# Patient Record
Sex: Female | Born: 1980 | Race: White | Hispanic: No | Marital: Married | Smoking: Former smoker
Health system: Southern US, Community
[De-identification: ages and names within clinical notes are randomized; demographics above are authoritative.]

## PROBLEM LIST (undated history)

## (undated) DIAGNOSIS — R87629 Unspecified abnormal cytological findings in specimens from vagina: Secondary | ICD-10-CM

## (undated) DIAGNOSIS — IMO0002 Reserved for concepts with insufficient information to code with codable children: Secondary | ICD-10-CM

## (undated) DIAGNOSIS — R87619 Unspecified abnormal cytological findings in specimens from cervix uteri: Secondary | ICD-10-CM

## (undated) DIAGNOSIS — Z8619 Personal history of other infectious and parasitic diseases: Secondary | ICD-10-CM

## (undated) HISTORY — DX: Unspecified abnormal cytological findings in specimens from cervix uteri: R87.619

## (undated) HISTORY — DX: Unspecified abnormal cytological findings in specimens from vagina: R87.629

## (undated) HISTORY — DX: Personal history of other infectious and parasitic diseases: Z86.19

## (undated) HISTORY — DX: Reserved for concepts with insufficient information to code with codable children: IMO0002

## (undated) HISTORY — PX: CRYOTHERAPY: SHX1416

---

## 1999-08-15 ENCOUNTER — Emergency Department (HOSPITAL_COMMUNITY): Admission: EM | Admit: 1999-08-15 | Discharge: 1999-08-15 | Payer: Self-pay | Admitting: Emergency Medicine

## 2000-10-19 ENCOUNTER — Emergency Department (HOSPITAL_COMMUNITY): Admission: EM | Admit: 2000-10-19 | Discharge: 2000-10-19 | Payer: Self-pay

## 2000-10-19 ENCOUNTER — Encounter: Payer: Self-pay | Admitting: Emergency Medicine

## 2004-02-12 ENCOUNTER — Emergency Department (HOSPITAL_COMMUNITY): Admission: EM | Admit: 2004-02-12 | Discharge: 2004-02-12 | Payer: Self-pay | Admitting: Emergency Medicine

## 2004-07-03 ENCOUNTER — Emergency Department (HOSPITAL_COMMUNITY): Admission: EM | Admit: 2004-07-03 | Discharge: 2004-07-03 | Payer: Self-pay | Admitting: Emergency Medicine

## 2004-07-17 ENCOUNTER — Ambulatory Visit (HOSPITAL_COMMUNITY): Admission: RE | Admit: 2004-07-17 | Discharge: 2004-07-17 | Payer: Self-pay | Admitting: Obstetrics and Gynecology

## 2004-10-02 ENCOUNTER — Ambulatory Visit (HOSPITAL_COMMUNITY): Admission: RE | Admit: 2004-10-02 | Discharge: 2004-10-02 | Payer: Self-pay | Admitting: Obstetrics and Gynecology

## 2005-02-09 ENCOUNTER — Ambulatory Visit (HOSPITAL_COMMUNITY): Admission: RE | Admit: 2005-02-09 | Discharge: 2005-02-09 | Payer: Self-pay | Admitting: Obstetrics and Gynecology

## 2005-03-04 ENCOUNTER — Inpatient Hospital Stay (HOSPITAL_COMMUNITY): Admission: AD | Admit: 2005-03-04 | Discharge: 2005-03-06 | Payer: Self-pay | Admitting: Obstetrics and Gynecology

## 2005-04-21 ENCOUNTER — Other Ambulatory Visit: Admission: RE | Admit: 2005-04-21 | Discharge: 2005-04-21 | Payer: Self-pay | Admitting: Obstetrics and Gynecology

## 2005-07-19 HISTORY — PX: HAND SURGERY: SHX662

## 2005-11-17 IMAGING — US US OB TRANSVAGINAL
1 series · 14 of 27 positions shown · non-contrast
Comparison: none

CLINICAL DATA: Threatened abortion.  7 weeks.  Follow-up.  G3 P0 AB2.  LMP 05/19/04.
 TRANSVAGINAL OBSTETRICAL ULTRASOUND:  
 Transvaginal scanning of the pelvis was performed.
 There is an intrauterine gestational sac containing a single living embryo with a regular heart rate of 168 bpm.  By crown rump length, the gestation is estimated at 7 weeks 3 days.  This is within two days of first ultrasound of 07/03/04 and 1 week behind LMP dating.  Growth has been appropriate as compared to prior ultrasound.  Yolk sac and amnion are present.  There may be a very small subchorionic hemorrhage present. 
 Right ovary is normal measuring 2.5 x 1.1 x 1.5 cm.  The left ovary is normal measuring 2.4 x 1.4 x 1.8 cm.  It contains a resolving corpus luteum.  No free pelvic fluid is noted.

[Series 1: us ob transvaginal · 14 of 27 slices shown]
[im 1/27]
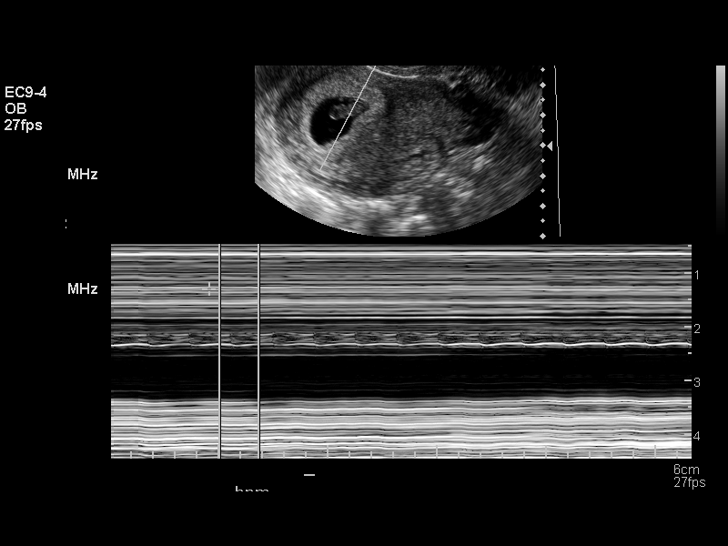
[im 3/27]
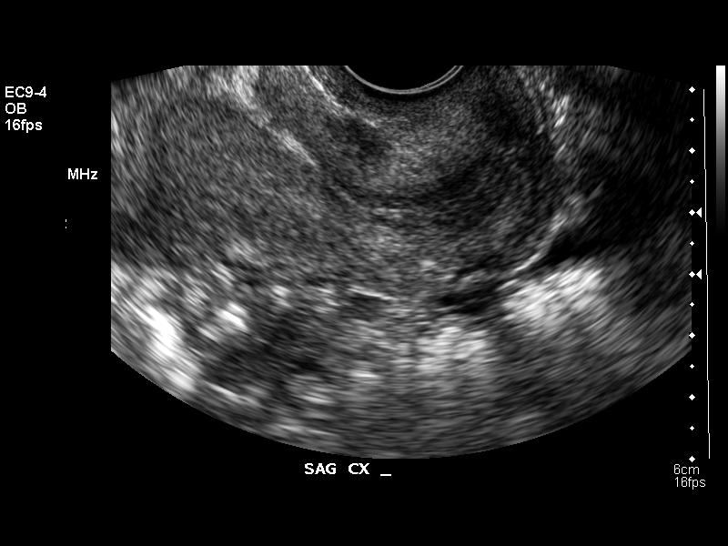
[im 5/27]
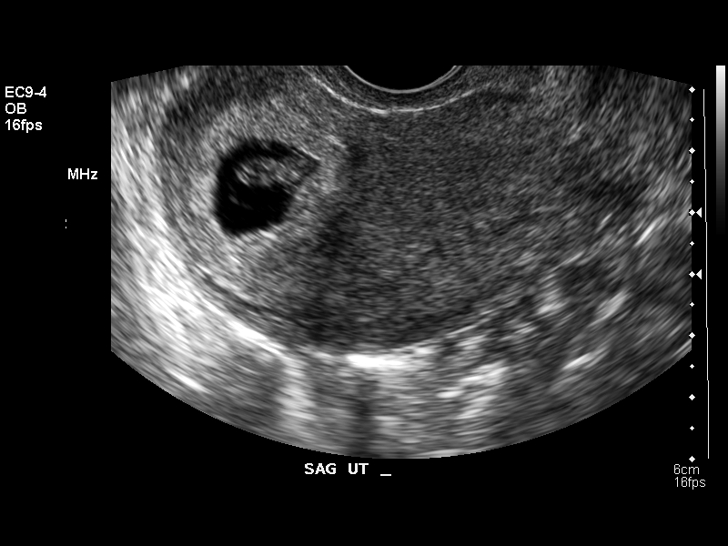
[im 7/27]
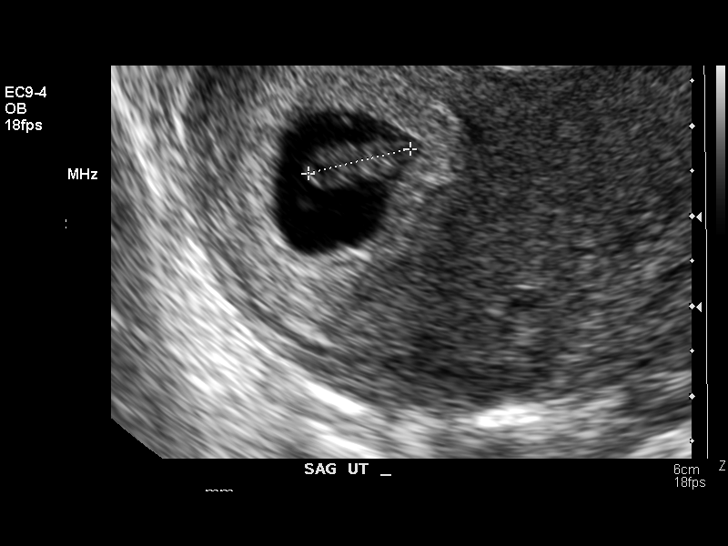
[im 9/27]
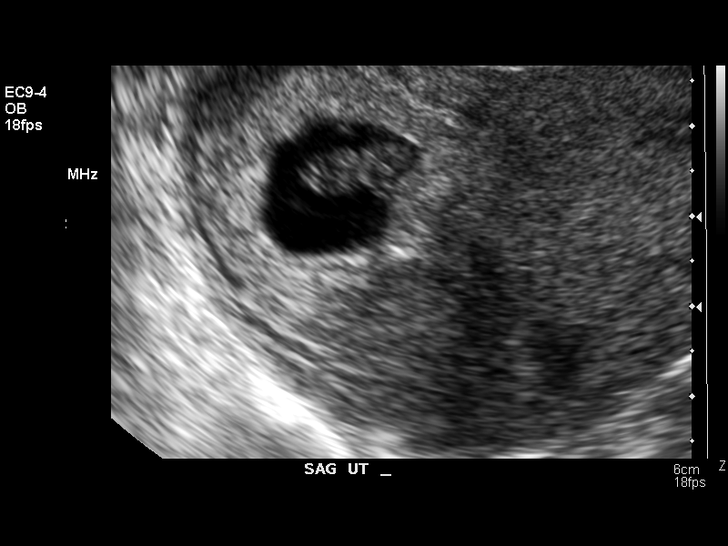
[im 11/27]
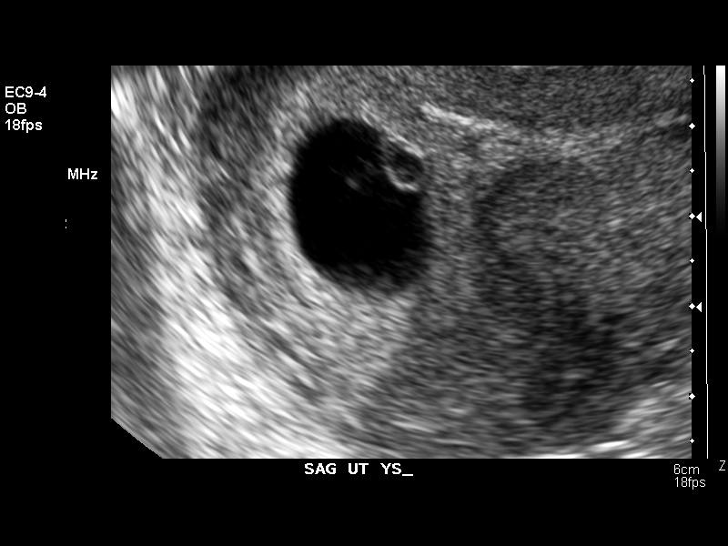
[im 13/27]
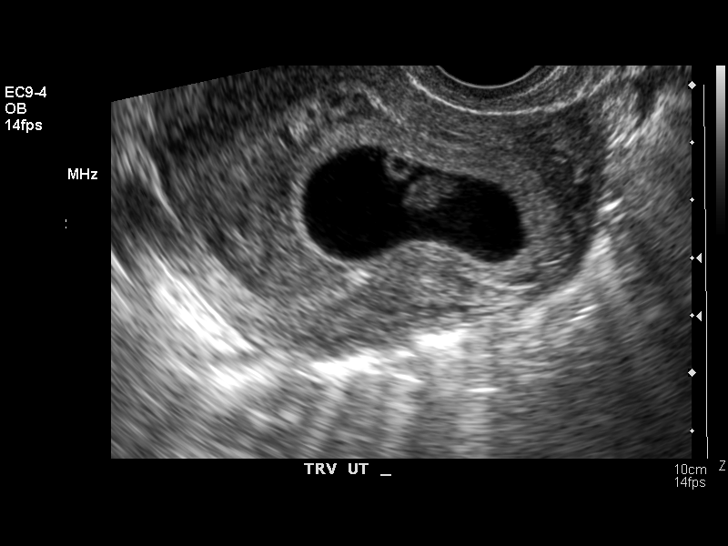
[im 15/27]
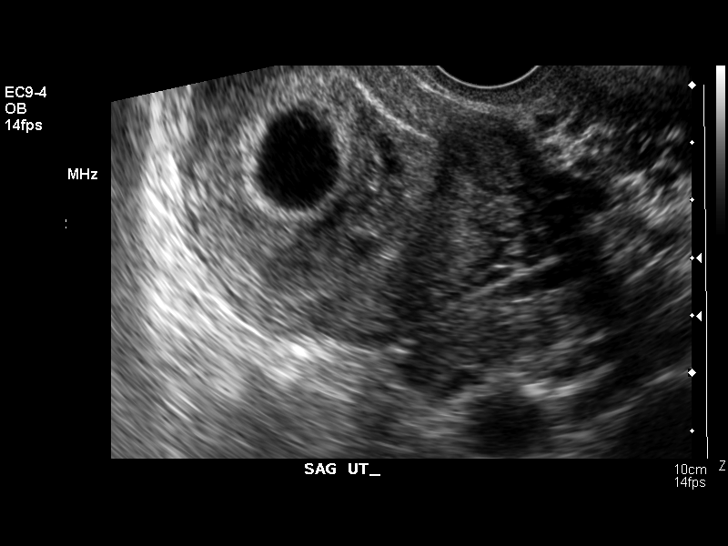
[im 17/27]
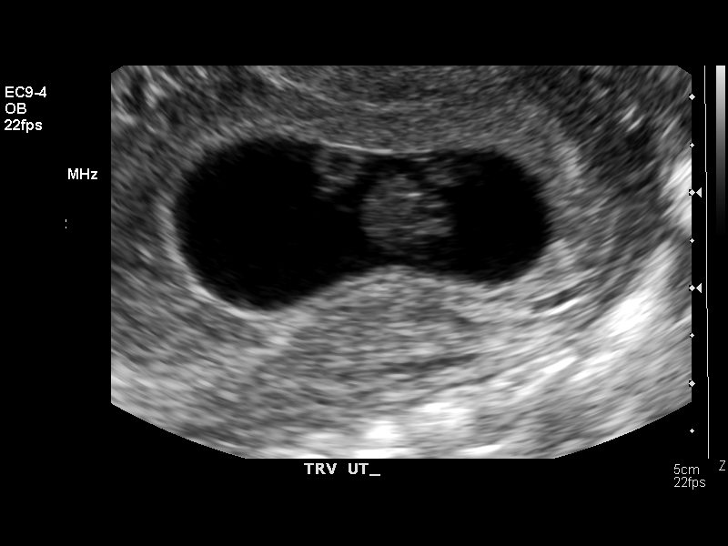
[im 19/27]
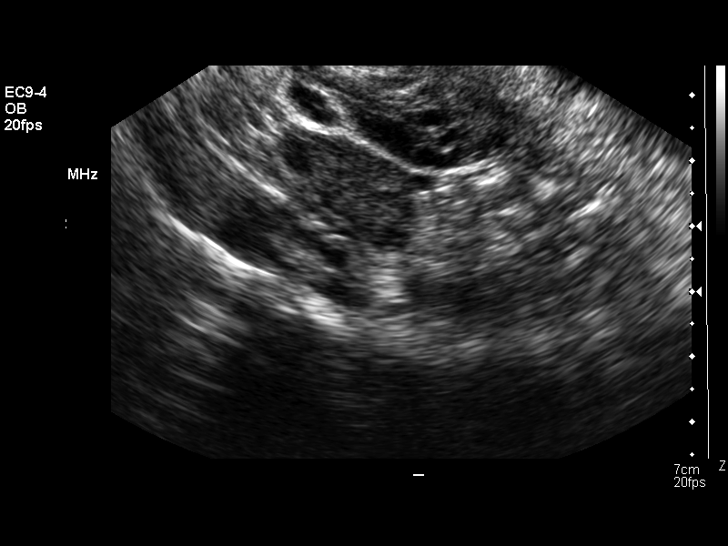
[im 21/27]
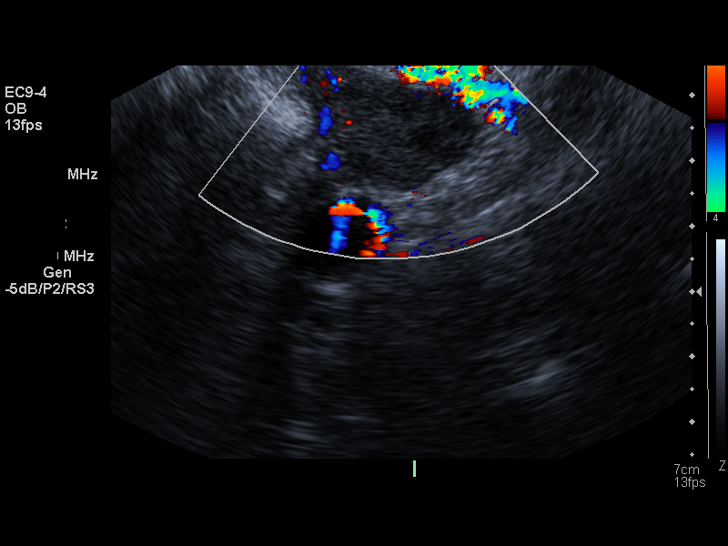
[im 23/27]
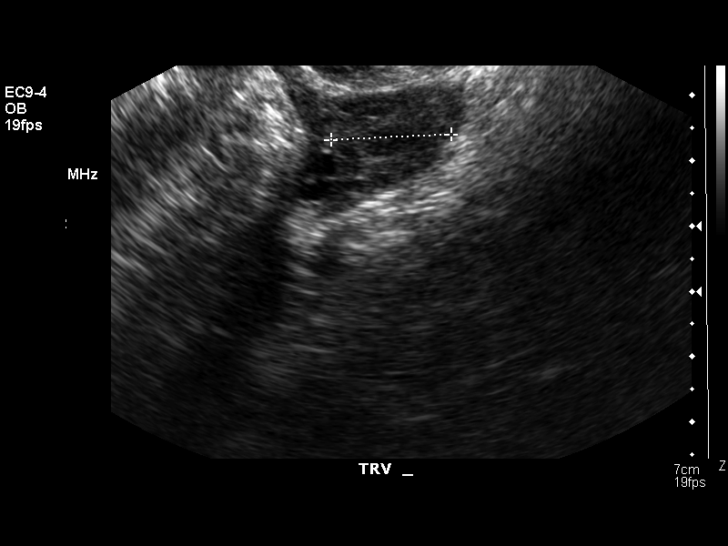
[im 25/27]
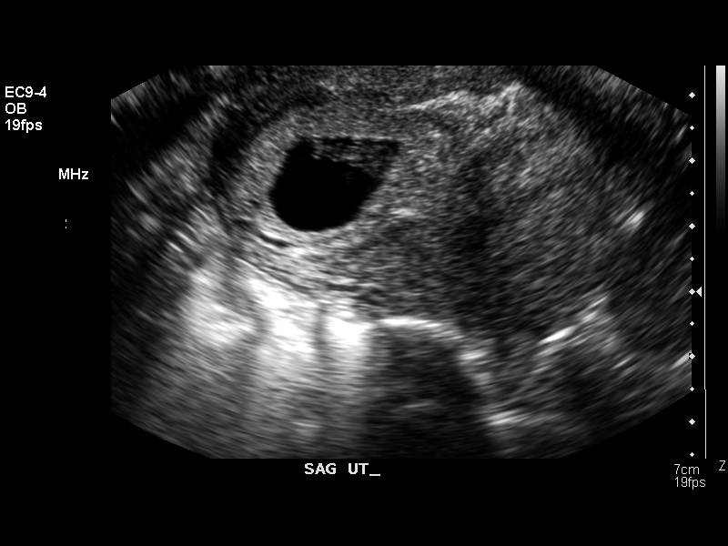
[im 27/27]
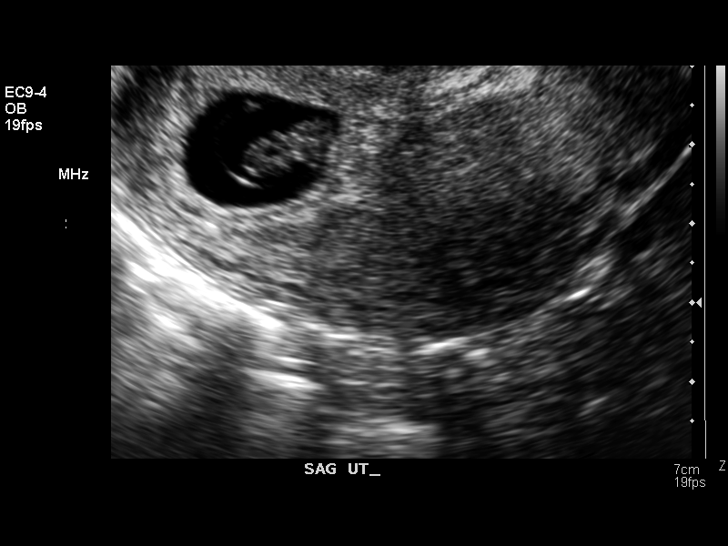

[14 of 27 positions shown; findings below may reference images not displayed]

IMPRESSION: 1.  Single living intrauterine embryo.  Patient is 7 weeks 5 days by first ultrasound and measures 7 weeks 3 days today indicating appropriate growth as compared to prior ultrasound.  
 2.  Corpus luteum in the left ovary.  Normal right ovary.  
 3.  Very small subchorionic hemorrhage.

## 2006-01-18 ENCOUNTER — Emergency Department (HOSPITAL_COMMUNITY): Admission: EM | Admit: 2006-01-18 | Discharge: 2006-01-18 | Payer: Self-pay | Admitting: Emergency Medicine

## 2006-01-25 ENCOUNTER — Ambulatory Visit (HOSPITAL_COMMUNITY): Admission: RE | Admit: 2006-01-25 | Discharge: 2006-01-25 | Payer: Self-pay | Admitting: Orthopedic Surgery

## 2006-05-11 ENCOUNTER — Other Ambulatory Visit: Admission: RE | Admit: 2006-05-11 | Discharge: 2006-05-11 | Payer: Self-pay | Admitting: Obstetrics and Gynecology

## 2010-08-28 ENCOUNTER — Inpatient Hospital Stay (HOSPITAL_COMMUNITY)
Admission: AD | Admit: 2010-08-28 | Discharge: 2010-08-30 | DRG: 775 | Disposition: A | Discharge status: Home / Self Care | Payer: Commercial Managed Care - PPO | Source: Ambulatory Visit | Attending: Obstetrics and Gynecology | Admitting: Obstetrics and Gynecology

## 2010-08-28 ENCOUNTER — Other Ambulatory Visit: Payer: Self-pay | Admitting: Obstetrics and Gynecology

## 2010-08-29 LAB — CBC
HCT: 34.8 % — ABNORMAL LOW (ref 36.0–46.0)
Hemoglobin: 12 g/dL (ref 12.0–15.0)
MCH: 32.7 pg (ref 26.0–34.0)
MCHC: 34.5 g/dL (ref 30.0–36.0)
Platelets: 178 10*3/uL (ref 150–400)
RBC: 3.67 MIL/uL — ABNORMAL LOW (ref 3.87–5.11)
RDW: 12.1 % (ref 11.5–15.5)
WBC: 14.8 10*3/uL — ABNORMAL HIGH (ref 4.0–10.5)

## 2010-09-01 ENCOUNTER — Inpatient Hospital Stay (HOSPITAL_COMMUNITY): Admission: AD | Admit: 2010-09-01 | Payer: Self-pay | Source: Home / Self Care | Admitting: Obstetrics and Gynecology

## 2010-09-06 ENCOUNTER — Inpatient Hospital Stay (HOSPITAL_COMMUNITY)
Admission: AD | Admit: 2010-09-06 | Discharge: 2010-09-06 | Disposition: A | Discharge status: Home / Self Care | Payer: Commercial Managed Care - PPO | Source: Ambulatory Visit | Attending: Obstetrics and Gynecology | Admitting: Obstetrics and Gynecology

## 2010-09-06 DIAGNOSIS — O139 Gestational [pregnancy-induced] hypertension without significant proteinuria, unspecified trimester: Secondary | ICD-10-CM | POA: Insufficient documentation

## 2010-09-06 LAB — CBC
Hemoglobin: 14.9 g/dL (ref 12.0–15.0)
Platelets: 328 10*3/uL (ref 150–400)
RDW: 11.9 % (ref 11.5–15.5)

## 2010-09-06 LAB — URINALYSIS, ROUTINE W REFLEX MICROSCOPIC
Ketones, ur: NEGATIVE mg/dL
Specific Gravity, Urine: 1.01 (ref 1.005–1.030)
Urine Glucose, Fasting: NEGATIVE mg/dL
pH: 5.5 (ref 5.0–8.0)

## 2010-09-06 LAB — COMPREHENSIVE METABOLIC PANEL
Alkaline Phosphatase: 90 U/L (ref 39–117)
BUN: 18 mg/dL (ref 6–23)
Calcium: 9.6 mg/dL (ref 8.4–10.5)
Creatinine, Ser: 1.12 mg/dL (ref 0.4–1.2)
GFR calc Af Amer: >60 mL/min (ref >60)
GFR calc non Af Amer: 57 mL/min — ABNORMAL LOW (ref >60)
Sodium: 136 mEq/L (ref 135–145)
Total Bilirubin: 0.4 mg/dL (ref 0.3–1.2)

## 2010-09-06 LAB — URIC ACID: Uric Acid, Serum: 6.9 mg/dL (ref 2.4–7.0)

## 2010-11-16 ENCOUNTER — Inpatient Hospital Stay (HOSPITAL_COMMUNITY)
Admission: AD | Admit: 2010-11-16 | Discharge: 2010-11-16 | Disposition: A | Discharge status: Home / Self Care | Payer: Commercial Managed Care - PPO | Source: Ambulatory Visit | Attending: Obstetrics and Gynecology | Admitting: Obstetrics and Gynecology

## 2010-11-16 DIAGNOSIS — O9122 Nonpurulent mastitis associated with the puerperium: Secondary | ICD-10-CM | POA: Insufficient documentation

## 2010-11-16 DIAGNOSIS — IMO0002 Reserved for concepts with insufficient information to code with codable children: Secondary | ICD-10-CM | POA: Insufficient documentation

## 2010-12-04 NOTE — H&P (Signed)
NAMEKENIKA, SAHM             ACCOUNT NO.:  [id number]   MEDICAL RECORD NO.:  [id number]          PATIENT TYPE:  INP   LOCATION:  9163                          FACILITY:  [facility]   PHYSICIAN:  Naima A. Dillard, M.D. DATE OF BIRTH:  08-06-1980   DATE OF ADMISSION:  03/04/2005  DATE OF DISCHARGE:                                HISTORY & PHYSICAL   Ms. [name] is a 30 year old single, white female, gravida 3, para 0, 0, 2, 0  at 41-weeks who presents with regular uterine contractions tonight. She  denies leaking and denies signs and symptoms of PIH. She has had some small  spotting since her membranes were stripped in the office today. Her  pregnancy has been followed by [facility] M.D. service and has  been remarkable for:  1.  Cryosurgery.  2.  First trimester spotting.  3.  Previous smoker.  4.  Group B strep negative.  5.  Her cervix in the office today was dilated to 3 cm.   PRENATAL LABS:  Labs collected on August 25, 2004:  hemoglobin 12.8,  hematocrit 36.5, platelets 237,000. Blood type B positive, antibody  negative. RPR nonreactive. Rubella immune. Hepatitis B surface antigen  negative. Pap smear from July, 2005 was within normal limits. Gonorrhea and  Chlamydia from December, 2005 were negative. One hour Glucola from Nov 30, 2004 was 168. Her RPR at that time was nonreactive. Three hour glucose  tolerance test on Dec 07, 2004 was within normal limits. Culture of the  vaginal tract for group B strep, gonorrhea and Chlamydia on January 26, 2005  were all negative.   HISTORY OF PRESENT PREGNANCY:  The patient presented for care at [facility] on August 06, 2004 at [redacted] weeks gestation. She declined a quadruple  screen. The patient had ultrasonography for anatomy at [facility]  [facility] gestation and then had a follow up ultrasound at [facility]  the following day; there are no records here currently. The patient had an  elevated 1 hour Glucola  requiring a 3-hour GTT which was normal. The rest of  her prenatal care was unremarkable.   OBSTETRIC HISTORY:  She is a gravida 3, para 0, 0, 0, 2, 0. In 2000 and in  2002 she had EABs.   PAST MEDICAL HISTORY:  She has NO MEDICATION ALLERGIES. She experienced  menarche at the age of 7 with 8 to 30 day cycles lasting 5 days. She had  cryosurgery in 2000. She has an occasional yeast infection. She reports  having had the usual childhood illnesses. The patient is a previous smoker.   FAMILY HISTORY:  Remarkable for mother with Crohn's disease.   GENETIC HISTORY:  Remarkable for the patient's cousin having a heart murmur.   SOCIAL HISTORY:  Father of the baby is involved and supportive, his name is  Thayer Ohm. The patient is single. She has 4 years of college education and is  employed full time as a bar tender. Father of the baby has 4 years of  college education and is employed full time in [employer]. Prior to the  pregnancy  patient had approximately 10 drinks per week, and 10 cigarettes per day but  none since positive pregnancy test.   PHYSICAL EXAMINATION:  VITAL SIGNS:  Initial blood pressure was 140/100, now  is 146/73, other vital signs are stable, she is afebrile.  HEENT:  Grossly within normal limits.  CHEST:  Clear to auscultation.  HEART:  Regular rate and rhythm.  ABDOMEN:  Gravid in contour with fundal height extending approximately 40 cm  above the pubic symphysis. Fetal heart rate is reactive. Contractions every  2-3 minutes, mild to moderate.  PELVIC:  Cervix is 4 cm, 90%, vertex at -2 with bulging bag of waters.  EXTREMITIES:  Normal.   ASSESSMENT:  1.  Intrauterine pregnancy at term.  2.  Early labor.  3.  Group B strep negative.   PLAN:  1.  Admit to birthing suites, Dr. Normand Sloop has been notified.  2.  Routine M.D. orders.  3.  PIH labs.  4.  Patient plans epidural as labor progresses.      Cam Hai, C.N.M.      Naima A. Normand Sloop, M.D.   Electronically Signed    KS/MEDQ  D:  03/04/2005  T:  03/04/2005  Job:  [id number]

## 2010-12-04 NOTE — Op Note (Signed)
Amber [name], Amber [name]             ACCOUNT NO.:  [id number]   MEDICAL RECORD NO.:  [id number]          PATIENT TYPE:  AMB   LOCATION:  SDS                          FACILITY:  [facility]   PHYSICIAN:  Dionne Ano. Gramig III, M.D.DATE OF BIRTH:  Dec 13, 1980   DATE OF PROCEDURE:  DATE OF DISCHARGE:                                 OPERATIVE REPORT   PREOPERATIVE DIAGNOSIS:  Displaced left comminuted third metacarpal  fracture.   POSTOPERATIVE DIAGNOSIS:  Displaced left comminuted third metacarpal  fracture.   PROCEDURE:  1.  Open reduction/internal fixation of left third metacarpal fracture.  2.  Stress radiography.   SURGEON:  Dominica Severin, M.D.   ASSISTANT:  None.   COMPLICATIONS:  None.   ANESTHESIA:  General.   TOURNIQUET TIME:  Less than an hour.   INDICATIONS FOR PROCEDURE:  Ms. [name] presents with the above-mentioned  diagnosis.  I have counseled her regarding the risks and benefits of  surgery, and she desires to proceed with the above-mentioned operative  intervention, understanding and accepting the risks and benefits of surgery.   OPERATION IN DETAIL:  Patient was given Ancef preoperatively, taken to the  operating suite and underwent a smooth induction of general anesthesia, laid  supine and appropriately padded, prepped and draped in the usual sterile  fashion with Betadine scrub and paint.  Following this, the patient had a  curvilinear incision made under 250 mmHg of tourniquet control.  Dissection  was carried down.  Periosteum was incised, fracture was elevated, and  following this with combination of Freer elevator, curette and suction  device, the fracture sites were cleaned out.  I then performed provisional  fixation with clamps followed by the introduction of 3 interfragmentary  screws, 2.0 in nature.  This was procedure placed by drilling with a 1.5-  drill bit and placing a 2.0 screw after depth gauge measurement.  This  achieved excellent fixation.  She  was taken through a range of motion and  was stable, it looked quite well.  I was happy with the finger splay and  alignment.  I then closed the periosteal tissue with a combination of 2-0  and 4-0 Vicryl.  The patient had excellent range of motion of her extensor  apparatus.  Following this, Marcaine was placed for postop analgesia.  The  skin edges were sutured with Prolene, tourniquet was deflated and  showed excellent hemostasis without complicating features.  She was placed  in a sterile splint and taken to the recovery room.  She will be given an  additional gram of Ancef on return to see [place] in the office in 7-10 days  after discharge tonight.           ______________________________  Dionne Ano. Everlene Other, M.D.     Nash Mantis  D:  01/25/2006  T:  01/26/2006  Job:  [id number]

## 2011-04-15 ENCOUNTER — Ambulatory Visit (INDEPENDENT_AMBULATORY_CARE_PROVIDER_SITE_OTHER): Payer: Self-pay | Admitting: General Surgery

## 2011-07-20 NOTE — L&D Delivery Note (Signed)
Patient was C/C/+2 and pushed for 5 minutes with epidural.   NSVD  female infant, Apgars 8,9, weight P.   The patient had one midline 2nd degree episiotomy repaired with 2-0 vicryl R. Fundus was firm. EBL was expected. Placenta was delivered intact. Vagina was clear.  Baby was vigorous to bedside.  Fuquan Wilson A

## 2011-09-07 ENCOUNTER — Other Ambulatory Visit: Payer: Self-pay | Admitting: Obstetrics and Gynecology

## 2011-09-28 LAB — OB RESULTS CONSOLE GC/CHLAMYDIA: Gonorrhea: NEGATIVE

## 2011-09-28 LAB — OB RESULTS CONSOLE ABO/RH: RH Type: POSITIVE

## 2011-09-28 LAB — OB RESULTS CONSOLE HEPATITIS B SURFACE ANTIGEN: Hepatitis B Surface Ag: NEGATIVE

## 2012-03-06 LAB — OB RESULTS CONSOLE GBS: GBS: NEGATIVE

## 2012-03-23 ENCOUNTER — Encounter (HOSPITAL_COMMUNITY): Payer: Self-pay | Admitting: *Deleted

## 2012-03-23 ENCOUNTER — Telehealth (HOSPITAL_COMMUNITY): Payer: Self-pay | Admitting: *Deleted

## 2012-03-23 NOTE — Telephone Encounter (Signed)
Preadmission screen  

## 2012-03-30 ENCOUNTER — Inpatient Hospital Stay (HOSPITAL_COMMUNITY)
Admission: AD | Admit: 2012-03-30 | Discharge: 2012-04-01 | DRG: 775 | Disposition: A | Discharge status: Home / Self Care | Payer: Commercial Managed Care - PPO | Source: Ambulatory Visit | Attending: Obstetrics and Gynecology | Admitting: Obstetrics and Gynecology

## 2012-03-30 ENCOUNTER — Inpatient Hospital Stay (HOSPITAL_COMMUNITY): Payer: Commercial Managed Care - PPO | Admitting: Anesthesiology

## 2012-03-30 ENCOUNTER — Encounter (HOSPITAL_COMMUNITY): Payer: Self-pay | Admitting: *Deleted

## 2012-03-30 ENCOUNTER — Encounter (HOSPITAL_COMMUNITY): Payer: Self-pay | Admitting: Anesthesiology

## 2012-03-30 DIAGNOSIS — Z3480 Encounter for supervision of other normal pregnancy, unspecified trimester: Secondary | ICD-10-CM

## 2012-03-30 LAB — CBC
HCT: 38.8 % (ref 36.0–46.0)
MCHC: 34 g/dL (ref 30.0–36.0)
Platelets: 217 10*3/uL (ref 150–400)
RDW: 12.6 % (ref 11.5–15.5)
WBC: 13.3 10*3/uL — ABNORMAL HIGH (ref 4.0–10.5)

## 2012-03-30 MED ORDER — DIPHENHYDRAMINE HCL 50 MG/ML IJ SOLN: 12.5000 mg | INTRAMUSCULAR | Status: DC | PRN

## 2012-03-30 MED ORDER — MAGNESIUM HYDROXIDE 400 MG/5ML PO SUSP: 30.0000 mL | ORAL | Status: DC | PRN

## 2012-03-30 MED ORDER — SODIUM CHLORIDE 0.9 % IJ SOLN: 3.0000 mL | INTRAMUSCULAR | Status: DC | PRN

## 2012-03-30 MED ORDER — FENTANYL 2.5 MCG/ML BUPIVACAINE 1/10 % EPIDURAL INFUSION (WH - ANES)
14.0000 mL/h | INTRAMUSCULAR | Status: DC
  Filled 2012-03-30: qty 60

## 2012-03-30 MED ORDER — PRENATAL MULTIVITAMIN CH
1.0000 | ORAL_TABLET | Freq: Every day | ORAL | Status: DC
  Administered 2012-03-30 – 2012-04-01 (×3): 1 via ORAL
  Filled 2012-03-30 (×3): qty 1

## 2012-03-30 MED ORDER — ACETAMINOPHEN 325 MG PO TABS: 650.0000 mg | ORAL_TABLET | ORAL | Status: DC | PRN

## 2012-03-30 MED ORDER — METHYLERGONOVINE MALEATE 0.2 MG PO TABS: 0.2000 mg | ORAL_TABLET | ORAL | Status: DC | PRN

## 2012-03-30 MED ORDER — SENNOSIDES-DOCUSATE SODIUM 8.6-50 MG PO TABS
2.0000 | ORAL_TABLET | Freq: Every day | ORAL | Status: DC
  Administered 2012-03-30 – 2012-03-31 (×2): 2 via ORAL

## 2012-03-30 MED ORDER — ONDANSETRON HCL 4 MG/2ML IJ SOLN: 4.0000 mg | INTRAMUSCULAR | Status: DC | PRN

## 2012-03-30 MED ORDER — ZOLPIDEM TARTRATE 5 MG PO TABS: 5.0000 mg | ORAL_TABLET | Freq: Every evening | ORAL | Status: DC | PRN

## 2012-03-30 MED ORDER — EPHEDRINE 5 MG/ML INJ: 10.0000 mg | INTRAVENOUS | Status: DC | PRN

## 2012-03-30 MED ORDER — WITCH HAZEL-GLYCERIN EX PADS: 1.0000 "application " | MEDICATED_PAD | CUTANEOUS | Status: DC | PRN

## 2012-03-30 MED ORDER — TETANUS-DIPHTH-ACELL PERTUSSIS 5-2.5-18.5 LF-MCG/0.5 IM SUSP: 0.5000 mL | Freq: Once | INTRAMUSCULAR | Status: DC

## 2012-03-30 MED ORDER — SIMETHICONE 80 MG PO CHEW: 80.0000 mg | CHEWABLE_TABLET | ORAL | Status: DC | PRN

## 2012-03-30 MED ORDER — OXYTOCIN 40 UNITS IN LACTATED RINGERS INFUSION - SIMPLE MED
INTRAVENOUS | Status: AC
  Filled 2012-03-30: qty 1000

## 2012-03-30 MED ORDER — DIBUCAINE 1 % RE OINT: 1.0000 "application " | TOPICAL_OINTMENT | RECTAL | Status: DC | PRN

## 2012-03-30 MED ORDER — CITRIC ACID-SODIUM CITRATE 334-500 MG/5ML PO SOLN: 30.0000 mL | ORAL | Status: DC | PRN

## 2012-03-30 MED ORDER — IBUPROFEN 600 MG PO TABS
600.0000 mg | ORAL_TABLET | Freq: Four times a day (QID) | ORAL | Status: DC | PRN
  Administered 2012-03-30: 600 mg via ORAL
  Filled 2012-03-30: qty 1

## 2012-03-30 MED ORDER — LIDOCAINE HCL (PF) 1 % IJ SOLN
30.0000 mL | INTRAMUSCULAR | Status: DC | PRN
  Filled 2012-03-30: qty 30

## 2012-03-30 MED ORDER — FERROUS SULFATE 325 (65 FE) MG PO TABS
325.0000 mg | ORAL_TABLET | Freq: Two times a day (BID) | ORAL | Status: DC
  Administered 2012-03-31 – 2012-04-01 (×3): 325 mg via ORAL
  Filled 2012-03-30 (×3): qty 1

## 2012-03-30 MED ORDER — LACTATED RINGERS IV SOLN
INTRAVENOUS | Status: DC
  Administered 2012-03-30: 13:00:00 via INTRAVENOUS

## 2012-03-30 MED ORDER — OXYCODONE-ACETAMINOPHEN 5-325 MG PO TABS: 1.0000 | ORAL_TABLET | ORAL | Status: DC | PRN

## 2012-03-30 MED ORDER — LANOLIN HYDROUS EX OINT: TOPICAL_OINTMENT | CUTANEOUS | Status: DC | PRN

## 2012-03-30 MED ORDER — IBUPROFEN 800 MG PO TABS
800.0000 mg | ORAL_TABLET | Freq: Three times a day (TID) | ORAL | Status: DC
  Administered 2012-03-30 – 2012-04-01 (×5): 800 mg via ORAL
  Filled 2012-03-30 (×5): qty 1

## 2012-03-30 MED ORDER — OXYTOCIN BOLUS FROM INFUSION: 500.0000 mL | Freq: Once | INTRAVENOUS | Status: DC

## 2012-03-30 MED ORDER — DIPHENHYDRAMINE HCL 25 MG PO CAPS: 25.0000 mg | ORAL_CAPSULE | Freq: Four times a day (QID) | ORAL | Status: DC | PRN

## 2012-03-30 MED ORDER — FENTANYL 2.5 MCG/ML BUPIVACAINE 1/10 % EPIDURAL INFUSION (WH - ANES)
INTRAMUSCULAR | Status: DC | PRN
  Administered 2012-03-30: 14 mL/h via EPIDURAL

## 2012-03-30 MED ORDER — SODIUM CHLORIDE 0.9 % IV SOLN: 250.0000 mL | INTRAVENOUS | Status: DC | PRN

## 2012-03-30 MED ORDER — BENZOCAINE-MENTHOL 20-0.5 % EX AERO
1.0000 "application " | INHALATION_SPRAY | CUTANEOUS | Status: DC | PRN
  Administered 2012-03-30: 1 via TOPICAL
  Filled 2012-03-30: qty 56

## 2012-03-30 MED ORDER — CYCLOBENZAPRINE HCL 5 MG PO TABS
5.0000 mg | ORAL_TABLET | Freq: Three times a day (TID) | ORAL | Status: DC | PRN
  Filled 2012-03-30: qty 1

## 2012-03-30 MED ORDER — PHENYLEPHRINE 40 MCG/ML (10ML) SYRINGE FOR IV PUSH (FOR BLOOD PRESSURE SUPPORT)
80.0000 ug | PREFILLED_SYRINGE | INTRAVENOUS | Status: DC | PRN
  Filled 2012-03-30: qty 5

## 2012-03-30 MED ORDER — ONDANSETRON HCL 4 MG PO TABS: 4.0000 mg | ORAL_TABLET | ORAL | Status: DC | PRN

## 2012-03-30 MED ORDER — OXYCODONE-ACETAMINOPHEN 5-325 MG PO TABS
1.0000 | ORAL_TABLET | ORAL | Status: DC | PRN
  Administered 2012-03-31 (×2): 1 via ORAL
  Filled 2012-03-30 (×2): qty 1

## 2012-03-30 MED ORDER — SODIUM CHLORIDE 0.9 % IJ SOLN: 3.0000 mL | Freq: Two times a day (BID) | INTRAMUSCULAR | Status: DC

## 2012-03-30 MED ORDER — METHYLERGONOVINE MALEATE 0.2 MG/ML IJ SOLN: 0.2000 mg | INTRAMUSCULAR | Status: DC | PRN

## 2012-03-30 MED ORDER — ONDANSETRON HCL 4 MG/2ML IJ SOLN: 4.0000 mg | Freq: Four times a day (QID) | INTRAMUSCULAR | Status: DC | PRN

## 2012-03-30 MED ORDER — LACTATED RINGERS IV SOLN: 500.0000 mL | INTRAVENOUS | Status: DC | PRN

## 2012-03-30 MED ORDER — EPHEDRINE 5 MG/ML INJ
10.0000 mg | INTRAVENOUS | Status: DC | PRN
  Filled 2012-03-30: qty 4

## 2012-03-30 MED ORDER — MEASLES, MUMPS & RUBELLA VAC ~~LOC~~ INJ: 0.5000 mL | INJECTION | Freq: Once | SUBCUTANEOUS | Status: DC

## 2012-03-30 MED ORDER — LACTATED RINGERS IV SOLN: 500.0000 mL | Freq: Once | INTRAVENOUS | Status: DC

## 2012-03-30 MED ORDER — LIDOCAINE HCL (PF) 1 % IJ SOLN
INTRAMUSCULAR | Status: DC | PRN
  Administered 2012-03-30 (×2): 4 mL

## 2012-03-30 MED ORDER — PHENYLEPHRINE 40 MCG/ML (10ML) SYRINGE FOR IV PUSH (FOR BLOOD PRESSURE SUPPORT): 80.0000 ug | PREFILLED_SYRINGE | INTRAVENOUS | Status: DC | PRN

## 2012-03-30 NOTE — H&P (Signed)
31 y.o. [redacted]w[redacted]d  G3P2002 comes in c/o labor.  Otherwise has good fetal movement and no bleeding.  Past Medical History  Diagnosis Date  . H/O varicella   . Abnormal Pap smear     Past Surgical History  Procedure Date  . Cryotherapy   . Hand surgery 2007    L hand    OB History    Grav Para Term Preterm Abortions TAB SAB Ect Mult Living   3 2 2       2      # Outc Date GA Lbr Len/2nd Wgt Sex Del Anes PTL Lv   1 TRM 2006 [redacted]w[redacted]d 08:00 6lb10oz(3.005kg) M SVD EPI  Yes   2 TRM 2012 [redacted]w[redacted]d 00:45 6lb12oz(3.062kg) M SVD EPI  Yes   3 CUR               History   Social History  . Marital Status: Married    Spouse Name: N/Amber [name]    Number of Children: N/Amber [name]  . Years of Education: N/Amber [name]   Occupational History  . Not on file.   Social History Main Topics  . Smoking status: Never Smoker   . Smokeless tobacco: Never Used  . Alcohol Use: No  . Drug Use: No  . Sexually Active: Yes    Birth Control/ Protection: None   Other Topics Concern  . Not on file   Social History Narrative  . No narrative on file   Review of patient's allergies indicates no known allergies.   Prenatal Course:  Uncomplicated.  Filed Vitals:   03/30/12 1204  BP: 122/81  Pulse: 84  Temp: 98.4 F (36.9 C)  Resp: 16     Lungs/Cor:  NAD Abdomen:  soft, gravid Ex:  no cords, erythema SVE:  4/90/-2 FHTs:  130s, good STV, NST R Toco:  q3-7   Amber [name]/P   Term labor.  GBS neg.  Amber Amber [name]

## 2012-03-30 NOTE — Anesthesia Preprocedure Evaluation (Signed)

## 2012-03-30 NOTE — MAU Note (Signed)
Patient states she has felt decreased fetal movement. Has been having some mild contractions. Denied bleeding or leaking. Has passed the mucus plug with brown discharge,.

## 2012-03-30 NOTE — Anesthesia Procedure Notes (Signed)
Epidural Patient location during procedure: OB Start time: 03/30/2012 2:55 PM  Staffing Anesthesiologist: Kamryn Messineo A. Performed by: anesthesiologist   Preanesthetic Checklist Completed: patient identified, site marked, surgical consent, pre-op evaluation, timeout performed, IV checked, risks and benefits discussed and monitors and equipment checked  Epidural Patient position: sitting Prep: site prepped and draped and DuraPrep Patient monitoring: continuous pulse ox and blood pressure Approach: midline Injection technique: LOR air  Needle:  Needle type: Tuohy  Needle gauge: 17 G Needle length: 9 cm and 9 Needle insertion depth: 5 cm Catheter type: closed end flexible Catheter size: 19 Gauge Catheter at skin depth: 10 cm Test dose: negative and Other  Assessment Events: blood not aspirated, injection not painful, no injection resistance, negative IV test and no paresthesia  Additional Notes Patient identified. Risks and benefits discussed including failed block, incomplete  Pain control, post dural puncture headache, nerve damage, paralysis, blood pressure Changes, nausea, vomiting, reactions to medications-both toxic and allergic and post Partum back pain. All questions were answered. Patient expressed understanding and wished to proceed. Sterile technique was used throughout procedure. Epidural site was Dressed with sterile barrier dressing. No paresthesias, signs of intravascular injection Or signs of intrathecal spread were encountered.  Patient was more comfortable after the epidural was dosed. Please see RN's note for documentation of vital signs and FHR which are stable.

## 2012-03-31 LAB — RPR: RPR Ser Ql: NONREACTIVE

## 2012-03-31 LAB — CBC
Hemoglobin: 11.1 g/dL — ABNORMAL LOW (ref 12.0–15.0)
MCHC: 34.3 g/dL (ref 30.0–36.0)
Platelets: 194 10*3/uL (ref 150–400)
RBC: 3.3 MIL/uL — ABNORMAL LOW (ref 3.87–5.11)

## 2012-03-31 NOTE — Anesthesia Postprocedure Evaluation (Signed)
  Anesthesia Post-op Note  Patient: Amber [name]  Procedure(s) Performed: * No procedures listed *  Patient Location: Mother/Baby  Anesthesia Type: Epidural  Level of Consciousness: awake, alert  and oriented  Airway and Oxygen Therapy: Patient Spontanous Breathing  Post-op Pain: none  Post-op Assessment: Post-op Vital signs reviewed, Patient's Cardiovascular Status Stable, No headache, No backache, No residual numbness and No residual motor weakness  Post-op Vital Signs: Reviewed and stable  Complications: No apparent anesthesia complications

## 2012-04-01 NOTE — Discharge Summary (Signed)
Obstetric Discharge Summary Reason for Admission: onset of labor Prenatal Procedures: ultrasound Intrapartum Procedures: spontaneous vaginal delivery Postpartum Procedures: none Complications-Operative and Postpartum: 2 degree perineal laceration Hemoglobin  Date Value Range Status  03/31/2012 11.1* 12.0 - 15.0 g/dL Final     HCT  Date Value Range Status  03/31/2012 32.4* 36.0 - 46.0 % Final    Physical Exam:  General: alert Lochia: appropriate Uterine Fundus: firm   Discharge Diagnoses: Term Pregnancy-delivered  Discharge Information: Date: 04/01/2012 Activity: pelvic rest Diet: routine Medications: PNV and Ibuprofen Condition: stable Instructions: refer to practice specific booklet Discharge to: home   Newborn Data: Live born female  Birth Weight: 7 lb 5.1 oz (3320 g) APGAR: 8, 9  Home with mother.  Amber [name] E 04/01/2012, 6:34 AM

## 2012-04-01 NOTE — Progress Notes (Signed)
PPD#2 Pt has no c/o. Ready to go home. VSSAF PLAN/ Will discharge.

## 2012-04-04 ENCOUNTER — Inpatient Hospital Stay (HOSPITAL_COMMUNITY): Admission: RE | Admit: 2012-04-04 | Payer: Commercial Managed Care - PPO | Source: Ambulatory Visit

## 2013-07-19 NOTE — L&D Delivery Note (Signed)
Patient was C/C/+2 and pushed for 5 minutes with epidural.   NSVD  female infant, Apgars 9,9, weight P.   The patient had no lacerations. Fundus was firm. EBL was expected amount. Placenta was delivered intact. Vagina was clear.  Baby was vigorous and doing skin to skin with mother.  Malya Cirillo A

## 2014-01-10 LAB — OB RESULTS CONSOLE ANTIBODY SCREEN: ANTIBODY SCREEN: NEGATIVE

## 2014-01-10 LAB — OB RESULTS CONSOLE ABO/RH: RH Type: POSITIVE

## 2014-01-10 LAB — OB RESULTS CONSOLE RPR: RPR: NONREACTIVE

## 2014-01-10 LAB — OB RESULTS CONSOLE HEPATITIS B SURFACE ANTIGEN: HEP B S AG: NEGATIVE

## 2014-01-10 LAB — OB RESULTS CONSOLE RUBELLA ANTIBODY, IGM: RUBELLA: IMMUNE

## 2014-01-10 LAB — OB RESULTS CONSOLE HIV ANTIBODY (ROUTINE TESTING): HIV: NONREACTIVE

## 2014-05-20 ENCOUNTER — Encounter (HOSPITAL_COMMUNITY): Payer: Self-pay | Admitting: *Deleted

## 2014-06-26 LAB — OB RESULTS CONSOLE GBS: GBS: NEGATIVE

## 2014-07-01 ENCOUNTER — Inpatient Hospital Stay (HOSPITAL_COMMUNITY)
Admission: AD | Admit: 2014-07-01 | Payer: Commercial Managed Care - PPO | Source: Ambulatory Visit | Admitting: Obstetrics and Gynecology

## 2014-07-17 ENCOUNTER — Encounter (HOSPITAL_COMMUNITY): Payer: Self-pay | Admitting: *Deleted

## 2014-07-17 ENCOUNTER — Telehealth (HOSPITAL_COMMUNITY): Payer: Self-pay | Admitting: *Deleted

## 2014-07-17 NOTE — Telephone Encounter (Signed)
Preadmission screen  

## 2014-07-18 ENCOUNTER — Inpatient Hospital Stay (HOSPITAL_COMMUNITY): Payer: BC Managed Care – PPO | Admitting: Anesthesiology

## 2014-07-18 ENCOUNTER — Inpatient Hospital Stay (HOSPITAL_COMMUNITY)
Admission: RE | Admit: 2014-07-18 | Discharge: 2014-07-20 | DRG: 775 | Disposition: A | Discharge status: Home / Self Care | Payer: BC Managed Care – PPO | Source: Ambulatory Visit | Attending: Obstetrics and Gynecology | Admitting: Obstetrics and Gynecology

## 2014-07-18 ENCOUNTER — Encounter (HOSPITAL_COMMUNITY): Payer: Self-pay

## 2014-07-18 DIAGNOSIS — Z3A39 39 weeks gestation of pregnancy: Secondary | ICD-10-CM | POA: Diagnosis present

## 2014-07-18 DIAGNOSIS — Z3490 Encounter for supervision of normal pregnancy, unspecified, unspecified trimester: Secondary | ICD-10-CM

## 2014-07-18 LAB — RPR

## 2014-07-18 LAB — CBC
HCT: 37.5 % (ref 36.0–46.0)
HEMOGLOBIN: 13.1 g/dL (ref 12.0–15.0)
MCH: 33.9 pg (ref 26.0–34.0)
MCHC: 34.9 g/dL (ref 30.0–36.0)
MCV: 97.2 fL (ref 78.0–100.0)
Platelets: 180 10*3/uL (ref 150–400)
RBC: 3.86 MIL/uL — AB (ref 3.87–5.11)
RDW: 12.8 % (ref 11.5–15.5)
WBC: 10.2 10*3/uL (ref 4.0–10.5)

## 2014-07-18 MED ORDER — FERROUS SULFATE 325 (65 FE) MG PO TABS
325.0000 mg | ORAL_TABLET | Freq: Two times a day (BID) | ORAL | Status: DC
  Administered 2014-07-18 – 2014-07-20 (×4): 325 mg via ORAL
  Filled 2014-07-18 (×4): qty 1

## 2014-07-18 MED ORDER — FENTANYL 2.5 MCG/ML BUPIVACAINE 1/10 % EPIDURAL INFUSION (WH - ANES)
14.0000 mL/h | INTRAMUSCULAR | Status: DC | PRN
  Administered 2014-07-18: 14 mL/h via EPIDURAL

## 2014-07-18 MED ORDER — SODIUM CHLORIDE 0.9 % IJ SOLN: 3.0000 mL | Freq: Two times a day (BID) | INTRAMUSCULAR | Status: DC

## 2014-07-18 MED ORDER — OXYCODONE-ACETAMINOPHEN 5-325 MG PO TABS: 1.0000 | ORAL_TABLET | ORAL | Status: DC | PRN

## 2014-07-18 MED ORDER — ONDANSETRON HCL 4 MG/2ML IJ SOLN: 4.0000 mg | INTRAMUSCULAR | Status: DC | PRN

## 2014-07-18 MED ORDER — PHENYLEPHRINE 40 MCG/ML (10ML) SYRINGE FOR IV PUSH (FOR BLOOD PRESSURE SUPPORT)
80.0000 ug | PREFILLED_SYRINGE | INTRAVENOUS | Status: DC | PRN
  Filled 2014-07-18: qty 2

## 2014-07-18 MED ORDER — PRENATAL MULTIVITAMIN CH
1.0000 | ORAL_TABLET | Freq: Every day | ORAL | Status: DC
  Administered 2014-07-19: 1 via ORAL
  Filled 2014-07-18: qty 1

## 2014-07-18 MED ORDER — WITCH HAZEL-GLYCERIN EX PADS: 1.0000 "application " | MEDICATED_PAD | CUTANEOUS | Status: DC | PRN

## 2014-07-18 MED ORDER — LACTATED RINGERS IV SOLN: INTRAVENOUS | Status: DC

## 2014-07-18 MED ORDER — IBUPROFEN 800 MG PO TABS
800.0000 mg | ORAL_TABLET | Freq: Three times a day (TID) | ORAL | Status: DC
  Administered 2014-07-18 – 2014-07-20 (×6): 800 mg via ORAL
  Filled 2014-07-18 (×6): qty 1

## 2014-07-18 MED ORDER — EPHEDRINE 5 MG/ML INJ
10.0000 mg | INTRAVENOUS | Status: DC | PRN
  Filled 2014-07-18: qty 2

## 2014-07-18 MED ORDER — LACTATED RINGERS IV SOLN: 500.0000 mL | INTRAVENOUS | Status: DC | PRN

## 2014-07-18 MED ORDER — ZOLPIDEM TARTRATE 5 MG PO TABS: 5.0000 mg | ORAL_TABLET | Freq: Every evening | ORAL | Status: DC | PRN

## 2014-07-18 MED ORDER — LACTATED RINGERS IV SOLN
500.0000 mL | Freq: Once | INTRAVENOUS | Status: AC
  Administered 2014-07-18: 500 mL via INTRAVENOUS

## 2014-07-18 MED ORDER — MAGNESIUM HYDROXIDE 400 MG/5ML PO SUSP
30.0000 mL | ORAL | Status: DC | PRN
Start: 2014-07-18 — End: 2014-07-20

## 2014-07-18 MED ORDER — MEASLES, MUMPS & RUBELLA VAC ~~LOC~~ INJ: 0.5000 mL | INJECTION | Freq: Once | SUBCUTANEOUS | Status: DC

## 2014-07-18 MED ORDER — SIMETHICONE 80 MG PO CHEW: 80.0000 mg | CHEWABLE_TABLET | ORAL | Status: DC | PRN

## 2014-07-18 MED ORDER — OXYCODONE-ACETAMINOPHEN 5-325 MG PO TABS: 2.0000 | ORAL_TABLET | ORAL | Status: DC | PRN

## 2014-07-18 MED ORDER — OXYTOCIN BOLUS FROM INFUSION
500.0000 mL | INTRAVENOUS | Status: DC
Start: 2014-07-18 — End: 2014-07-18

## 2014-07-18 MED ORDER — SODIUM CHLORIDE 0.9 % IJ SOLN: 3.0000 mL | INTRAMUSCULAR | Status: DC | PRN

## 2014-07-18 MED ORDER — DIPHENHYDRAMINE HCL 50 MG/ML IJ SOLN: 12.5000 mg | INTRAMUSCULAR | Status: DC | PRN

## 2014-07-18 MED ORDER — LANOLIN HYDROUS EX OINT: TOPICAL_OINTMENT | CUTANEOUS | Status: DC | PRN

## 2014-07-18 MED ORDER — METHYLERGONOVINE MALEATE 0.2 MG/ML IJ SOLN
0.2000 mg | INTRAMUSCULAR | Status: DC | PRN
Start: 2014-07-18 — End: 2014-07-20

## 2014-07-18 MED ORDER — CITRIC ACID-SODIUM CITRATE 334-500 MG/5ML PO SOLN: 30.0000 mL | ORAL | Status: DC | PRN

## 2014-07-18 MED ORDER — OXYTOCIN 40 UNITS IN LACTATED RINGERS INFUSION - SIMPLE MED
1.0000 m[IU]/min | INTRAVENOUS | Status: DC
  Administered 2014-07-18: 2 m[IU]/min via INTRAVENOUS
  Filled 2014-07-18: qty 1000

## 2014-07-18 MED ORDER — SENNOSIDES-DOCUSATE SODIUM 8.6-50 MG PO TABS
2.0000 | ORAL_TABLET | ORAL | Status: DC
  Administered 2014-07-18: 2 via ORAL
  Filled 2014-07-18: qty 2

## 2014-07-18 MED ORDER — LIDOCAINE HCL (PF) 1 % IJ SOLN
30.0000 mL | INTRAMUSCULAR | Status: DC | PRN
  Filled 2014-07-18: qty 30

## 2014-07-18 MED ORDER — OXYTOCIN 40 UNITS IN LACTATED RINGERS INFUSION - SIMPLE MED: 62.5000 mL/h | INTRAVENOUS | Status: DC

## 2014-07-18 MED ORDER — PHENYLEPHRINE 40 MCG/ML (10ML) SYRINGE FOR IV PUSH (FOR BLOOD PRESSURE SUPPORT)
PREFILLED_SYRINGE | INTRAVENOUS | Status: AC
  Filled 2014-07-18: qty 5

## 2014-07-18 MED ORDER — BENZOCAINE-MENTHOL 20-0.5 % EX AERO: 1.0000 "application " | INHALATION_SPRAY | CUTANEOUS | Status: DC | PRN

## 2014-07-18 MED ORDER — FLEET ENEMA 7-19 GM/118ML RE ENEM: 1.0000 | ENEMA | RECTAL | Status: DC | PRN

## 2014-07-18 MED ORDER — FENTANYL 2.5 MCG/ML BUPIVACAINE 1/10 % EPIDURAL INFUSION (WH - ANES)
INTRAMUSCULAR | Status: DC | PRN
  Administered 2014-07-18: 14 mL/h via EPIDURAL

## 2014-07-18 MED ORDER — ONDANSETRON HCL 4 MG PO TABS: 4.0000 mg | ORAL_TABLET | ORAL | Status: DC | PRN

## 2014-07-18 MED ORDER — SODIUM CHLORIDE 0.9 % IV SOLN: 250.0000 mL | INTRAVENOUS | Status: DC | PRN

## 2014-07-18 MED ORDER — TERBUTALINE SULFATE 1 MG/ML IJ SOLN: 0.2500 mg | Freq: Once | INTRAMUSCULAR | Status: DC | PRN

## 2014-07-18 MED ORDER — ONDANSETRON HCL 4 MG/2ML IJ SOLN: 4.0000 mg | Freq: Four times a day (QID) | INTRAMUSCULAR | Status: DC | PRN

## 2014-07-18 MED ORDER — FENTANYL 2.5 MCG/ML BUPIVACAINE 1/10 % EPIDURAL INFUSION (WH - ANES)
INTRAMUSCULAR | Status: AC
  Filled 2014-07-18: qty 125

## 2014-07-18 MED ORDER — OXYCODONE-ACETAMINOPHEN 5-325 MG PO TABS
1.0000 | ORAL_TABLET | ORAL | Status: DC | PRN
  Administered 2014-07-19 (×4): 1 via ORAL
  Filled 2014-07-18 (×4): qty 1

## 2014-07-18 MED ORDER — ACETAMINOPHEN 325 MG PO TABS: 650.0000 mg | ORAL_TABLET | ORAL | Status: DC | PRN

## 2014-07-18 MED ORDER — LIDOCAINE HCL (PF) 1 % IJ SOLN
INTRAMUSCULAR | Status: DC | PRN
  Administered 2014-07-18 (×2): 8 mL

## 2014-07-18 MED ORDER — METHYLERGONOVINE MALEATE 0.2 MG PO TABS: 0.2000 mg | ORAL_TABLET | ORAL | Status: DC | PRN

## 2014-07-18 MED ORDER — DIPHENHYDRAMINE HCL 25 MG PO CAPS: 25.0000 mg | ORAL_CAPSULE | Freq: Four times a day (QID) | ORAL | Status: DC | PRN

## 2014-07-18 MED ORDER — DIBUCAINE 1 % RE OINT: 1.0000 "application " | TOPICAL_OINTMENT | RECTAL | Status: DC | PRN

## 2014-07-18 MED ORDER — TETANUS-DIPHTH-ACELL PERTUSSIS 5-2.5-18.5 LF-MCG/0.5 IM SUSP: 0.5000 mL | Freq: Once | INTRAMUSCULAR | Status: DC

## 2014-07-18 NOTE — Anesthesia Preprocedure Evaluation (Signed)
Anesthesia Evaluation  Patient identified by MRN, date of birth, ID band Patient awake    Reviewed: Allergy & Precautions, H&P , Patient's Chart, lab work & pertinent test results  Airway Mallampati: I TM Distance: >3 FB Neck ROM: full    Dental no notable dental hx.    Pulmonary neg pulmonary ROS,    Pulmonary exam normal       Cardiovascular negative cardio ROS      Neuro/Psych negative neurological ROS  negative psych ROS   GI/Hepatic negative GI ROS, Neg liver ROS,   Endo/Other  negative endocrine ROS  Renal/GU negative Renal ROS     Musculoskeletal   Abdominal Normal abdominal exam  (+)   Peds  Hematology negative hematology ROS (+)   Anesthesia Other Findings   Reproductive/Obstetrics (+) Pregnancy                           Anesthesia Physical Anesthesia Plan  ASA: II  Anesthesia Plan: Epidural   Post-op Pain Management:    Induction:   Airway Management Planned:   Additional Equipment:   Intra-op Plan:   Post-operative Plan:   Informed Consent: I have reviewed the patients History and Physical, chart, labs and discussed the procedure including the risks, benefits and alternatives for the proposed anesthesia with the patient or authorized representative who has indicated his/her understanding and acceptance.     Plan Discussed with:   Anesthesia Plan Comments:         Anesthesia Quick Evaluation  

## 2014-07-18 NOTE — Anesthesia Procedure Notes (Signed)
Epidural Patient location during procedure: OB Start time: 07/18/2014 10:02 AM End time: 07/18/2014 10:06 AM  Staffing Anesthesiologist: Leilani AbleHATCHETT, Lashala Laser Performed by: anesthesiologist   Preanesthetic Checklist Completed: patient identified, surgical consent, pre-op evaluation, timeout performed, IV checked, risks and benefits discussed and monitors and equipment checked  Epidural Patient position: sitting Prep: site prepped and draped and DuraPrep Patient monitoring: continuous pulse ox and blood pressure Approach: midline Location: L3-L4 Injection technique: LOR air  Needle:  Needle type: Tuohy  Needle gauge: 17 G Needle length: 9 cm and 9 Needle insertion depth: 5 cm cm Catheter type: closed end flexible Catheter size: 19 Gauge Catheter at skin depth: 10 cm Test dose: negative and Other  Assessment Sensory level: T9 Events: blood not aspirated, injection not painful, no injection resistance, negative IV test and no paresthesia

## 2014-07-18 NOTE — H&P (Signed)
33 y.o. 5274w0d  G4P3003 comes in c/o for elective induction.  Otherwise has good fetal movement and no bleeding.  Past Medical History  Diagnosis Date  . H/O varicella   . Abnormal Pap smear   . Vaginal Pap smear, abnormal     Past Surgical History  Procedure Laterality Date  . Cryotherapy    . Hand surgery  2007    L hand    OB History  Gravida Para Term Preterm AB SAB TAB Ectopic Multiple Living  4 3 3       3     # Outcome Date GA Lbr Len/2nd Weight Sex Delivery Anes PTL Lv  4 Current           3 Term 03/30/12 5840w5d 17:55 / 00:08 3.32 kg (7 lb 5.1 oz) M Vag-Spont EPI  Y  2 Term 2012 6274w0d 00:45 3.062 kg (6 lb 12 oz) M Vag-Spont EPI  Y  1 Term 2006 7473w0d 08:00 3.005 kg (6 lb 10 oz) M Vag-Spont EPI  Y      History   Social History  . Marital Status: Married    Spouse Name: N/A    Number of Children: N/A  . Years of Education: N/A   Occupational History  . Not on file.   Social History Main Topics  . Smoking status: Never Smoker   . Smokeless tobacco: Never Used  . Alcohol Use: No  . Drug Use: No  . Sexual Activity: Yes    Birth Control/ Protection: None   Other Topics Concern  . Not on file   Social History Narrative   Review of patient's allergies indicates no known allergies.    Prenatal Transfer Tool  Maternal Diabetes: No Genetic Screening: Normal Maternal Ultrasounds/Referrals: Normal Fetal Ultrasounds or other Referrals:  None Maternal Substance Abuse:  No Significant Maternal Medications:  None Significant Maternal Lab Results: None  Other PNC: uncomplicated.    Filed Vitals:   07/18/14 0726  BP: 109/69  Pulse: 86  Temp: 97.5 F (36.4 C)  Resp: 20     Lungs/Cor:  NAD Abdomen:  soft, gravid Ex:  no cords, erythema SVE:  4/70/-2, AROM clear FHTs:  140, good STV, NST R Toco:  qocc   A/P   Term for elective induction.  GBS neg.  Amber [name] A

## 2014-07-19 LAB — CBC
HEMATOCRIT: 33.9 % — AB (ref 36.0–46.0)
HEMOGLOBIN: 11.7 g/dL — AB (ref 12.0–15.0)
MCH: 33.8 pg (ref 26.0–34.0)
MCHC: 34.5 g/dL (ref 30.0–36.0)
MCV: 98 fL (ref 78.0–100.0)
Platelets: 150 10*3/uL (ref 150–400)
RBC: 3.46 MIL/uL — ABNORMAL LOW (ref 3.87–5.11)
RDW: 12.9 % (ref 11.5–15.5)
WBC: 12.8 10*3/uL — ABNORMAL HIGH (ref 4.0–10.5)

## 2014-07-19 NOTE — Lactation Note (Signed)
This note was copied from the chart of Amber Melady Chow. Lactation Consultation Note: Initial visit with this experienced BF mom. She reports that baby has been nursing well with no pain. Mom eating lunch and dad holding sleeping baby at present. BF brochure given with resources for support after DC. No questions at present. To call prn  Patient Name: Amber [name] [id number] Date: 07/19/2014 Reason for consult: Initial assessment   Maternal Data Formula Feeding for Exclusion: No Does the patient have breastfeeding experience prior to this delivery?: Yes  Feeding Feeding Type: Breast Milk Length of feed: 20 min  LATCH Score/Interventions                      Lactation Tools Discussed/Used     Consult Status Consult Status: PRN    Pamelia Hoit 07/19/2014, 1:38 PM

## 2014-07-19 NOTE — Anesthesia Postprocedure Evaluation (Signed)
Anesthesia Post Note  Patient: Amber [name]  Procedure(s) Performed: * No procedures listed *  Anesthesia type: Epidural  Patient location: Mother/Baby  Post pain: Pain level controlled  Post assessment: Post-op Vital signs reviewed  Last Vitals:  Filed Vitals:   07/19/14 0603  BP: 98/59  Pulse: 62  Temp: 36.9 C  Resp: 18    Post vital signs: Reviewed  Level of consciousness: awake  Complications: No apparent anesthesia complications

## 2014-07-19 NOTE — Progress Notes (Signed)
Patient is eating, ambulating, voiding.  Pain control is good.  Appropriate lochia.  No complaints.  Filed Vitals:   07/18/14 1615 07/18/14 1715 07/18/14 2055 07/19/14 0603  BP: 109/64 106/67 101/56 98/59  Pulse: 74 78 70 62  Temp: 98.5 F (36.9 C) 98.4 F (36.9 C) 98 F (36.7 C) 98.4 F (36.9 C)  TempSrc: Oral Oral Oral Oral  Resp: Height:      Weight:      SpO2:        Fundus firm Perineum without swelling. No CT  Lab Results  Component Value Date   WBC 12.8* 07/19/2014   HGB 11.7* 07/19/2014   HCT 33.9* 07/19/2014   MCV 98.0 07/19/2014   PLT 150 07/19/2014    B/Positive/-- (06/25 0000)  A/P Post partum day 1  Routine care.  Expect d/c 1/2.    Philip Aspen

## 2014-07-20 NOTE — Discharge Summary (Signed)
Obstetric Discharge Summary Reason for Admission: induction of labor Prenatal Procedures: ultrasound Intrapartum Procedures: spontaneous vaginal delivery Postpartum Procedures: none Complications-Operative and Postpartum: none HEMOGLOBIN  Date Value Ref Range Status  07/19/2014 11.7* 12.0 - 15.0 g/dL Final   HCT  Date Value Ref Range Status  07/19/2014 33.9* 36.0 - 46.0 % Final    Physical Exam:  General: alert and cooperative Lochia: appropriate Uterine Fundus: firm DVT Evaluation: No evidence of DVT seen on physical exam.  Discharge Diagnoses: Term Pregnancy-delivered  Discharge Information: Date: 07/20/2014 Activity: pelvic rest Diet: routine Medications: PNV and Ibuprofen Condition: stable Instructions: refer to practice specific booklet Discharge to: home Follow-up Information    Follow up with HORVATH,MICHELLE A, MD In 4 weeks.   Specialty:  Obstetrics and Gynecology   Contact information:   [address] [place] [place] [address] [phone]       Newborn Data: Live born female  Birth Weight: 6 lb 15.6 oz (3165 g) APGAR: 9, 9  Home with mother.  Philip Aspen 07/20/2014, 11:26 AM

## 2015-04-10 ENCOUNTER — Encounter: Payer: Self-pay | Admitting: Family

## 2015-04-10 ENCOUNTER — Ambulatory Visit (INDEPENDENT_AMBULATORY_CARE_PROVIDER_SITE_OTHER): Payer: BLUE CROSS/BLUE SHIELD | Admitting: Family

## 2015-04-10 VITALS — BP 110/72 | HR 82 | Temp 97.6°F | Resp 18 | Ht 66.5 in | Wt 131.0 lb

## 2015-04-10 DIAGNOSIS — A6920 Lyme disease, unspecified: Secondary | ICD-10-CM | POA: Diagnosis not present

## 2015-04-10 NOTE — Patient Instructions (Signed)
Thank you for choosing [facility].  Summary/Instructions:  Please continue to take the doxycyline for a total of 21 days.   If your symptoms worsen or fail to improve, please contact our office for further instruction, or in case of emergency go directly to the emergency room at the closest medical facility.   Lyme Disease You may have been bitten by a tick and are to watch for the development of Lyme Disease. Lyme Disease is an infection that is caused by a bacteria The bacteria causing this disease is named Borreilia burgdorferi. If a tick is infected with this bacteria and then bites you, then Lyme Disease may occur. These ticks are carried by deer and rodents such as rabbits and mice and infest grassy as well as forested areas. Fortunately most tick bites do not cause Lyme Disease.  Lyme Disease is easier to prevent than to treat. First, covering your legs with clothing when walking in areas where ticks are possibly abundant will prevent their attachment because ticks tend to stay within inches of the ground. Second, using insecticides containing DEET can be applied on skin or clothing. Last, because it takes about 12 to 24 hours for the tick to transmit the disease after attachment to the human host, you should inspect your body for ticks twice a day when you are in areas where Lyme Disease is common. You must look thoroughly when searching for ticks. The Ixodes tick that carries Lyme Disease is very small. It is around the size of a sesame seed (picture of tick is not actual size). Removal is best done by grasping the tick by the head and pulling it out. Do not to squeeze the body of the tick. This could inject the infecting bacteria into the bite site. Wash the area of the bite with an antiseptic solution after removal.  Lyme Disease is a disease that may affect many body systems. Because of the small size of the biting tick, most people do not notice being bitten. The first sign of an  infection is usually a round red rash that extends out from the center of the tick bite. The center of the lesion may be blood colored (hemorrhagic) or have tiny blisters (vesicular). Most lesions have bright red outer borders and partial central clearing. This rash may extend out many inches in diameter, and multiple lesions may be present. Other symptoms such as fatigue, headaches, chills and fever, general achiness and swelling of lymph glands may also occur. If this first stage of the disease is left untreated, these symptoms may gradually resolve by themselves, or progressive symptoms may occur because of spread of infection to other areas of the body.  Follow up with your caregiver to have testing and treatment if you have a tick bite and you develop any of the above complaints. Your caregiver may recommend preventative (prophylactic) medications which kill bacteria (antibiotics). Once a diagnosis of Lyme Disease is made, antibiotic treatment is highly likely to cure the disease. Effective treatment of late stage Lyme Disease may require longer courses of antibiotic therapy.  MAKE SURE YOU:   Understand these instructions.  Will watch your condition.  Will get help right away if you are not doing well or get worse. Document Released: 10/11/2000 Document Revised: 09/27/2011 Document Reviewed: 12/13/2008 [facility] Patient Information 2015 [place], [place]. This information is not intended to replace advice given to you by your health care provider. Make sure you discuss any questions you have with your health care provider.

## 2015-04-10 NOTE — Progress Notes (Signed)
Pre visit review using our clinic review tool, if applicable. No additional management support is needed unless otherwise documented below in the visit note. 

## 2015-04-10 NOTE — Progress Notes (Signed)
Subjective:    Patient ID: Amber [name], female    DOB: [birth year], 34 y.o.   MRN: [id number]  Chief Complaint  Patient presents with  . Establish Care    Was diagnosed with lyme disease, she has been on doxy for 9 days, still has alot of fatigue and headaches    HPI:  Amber [name] is a 34 y.o. female who  has a past medical history of H/O varicella; Abnormal Pap smear; and Vaginal Pap smear, abnormal. and presents today for an office visit to establish care.  1.) Lyme Disease - Originally seen by Urgent Care and treated for a sinus infection with Augmentin which did not help her symptoms. She returned to Urgent Care and a sinus x-ray was negative and chest x-ray were clear. She was experiencing night sweats and low grade fever, headaches and extreme fatigue for a couple of weeks. The symptoms started with mild joint pains. Blood work performed revealed a positive titer for Lyme disease. She has been treated with doxycycline and notes improvement since starting the medication. She continues to experience the associated symptoms of headaches and fatigue and denies any rashes.   No Known Allergies   Outpatient Prescriptions Prior to Visit  Medication Sig Dispense Refill  . Prenatal Vit-Fe Fumarate-FA (PRENATAL MULTIVITAMIN) TABS Take 1 tablet by mouth daily.     No facility-administered medications prior to visit.     Past Medical History  Diagnosis Date  . H/O varicella   . Abnormal Pap smear   . Vaginal Pap smear, abnormal      Past Surgical History  Procedure Laterality Date  . Cryotherapy    . Hand surgery  2007    L hand     Family History  Problem Relation Age of Onset  . Alcohol abuse Father      Social History   Social History  . Marital Status: Married    Spouse Name: N/A  . Number of Children: 4  . Years of Education: 16   Occupational History  . RN    Social History Main Topics  . Smoking status: Former Smoker -- 0.75 packs/day  for 10 years  . Smokeless tobacco: Never Used  . Alcohol Use: Yes     Comment: occasionally  . Drug Use: No  . Sexual Activity: Yes    Birth Control/ Protection: None   Other Topics Concern  . Not on file   Social History Narrative   Fun: Occupational psychologist.   Denies abuse and feels safe at home.     Review of Systems  Constitutional: Positive for fatigue. Negative for fever and chills.  Musculoskeletal: Negative for arthralgias.  Skin: Negative for rash.  Neurological: Positive for headaches.      Objective:    BP 110/72 mmHg  Pulse 82  Temp(Src) 97.6 F (36.4 C) (Oral)  Resp 18  Ht 5' 6.5" (1.689 m)  Wt 131 lb (59.421 kg)  BMI 20.83 kg/m2  SpO2 96% Nursing note and vital signs reviewed.  Physical Exam  Constitutional: She is oriented to person, place, and time. She appears well-developed and well-nourished. No distress.  Cardiovascular: Normal rate, regular rhythm, normal heart sounds and intact distal pulses.   Pulmonary/Chest: Effort normal and breath sounds normal.  Neurological: She is alert and oriented to person, place, and time.  Skin: Skin is warm and dry.  Psychiatric: She has a normal mood and affect. Her behavior is normal. Judgment and thought content normal.  Assessment & Plan:   Problem List Items Addressed This Visit      Other   Early localized Lyme disease - Primary    Symptoms and exam consistent with localized Lyme disease currently treated with doxycycline and no evidence of rash. Continue previously prescribed doxycycline. Follow up at completion of therapy for re-evaluation of blood work or sooner if symptoms of progressing Lyme disease present.       Relevant Medications   doxycycline (VIBRAMYCIN) 100 MG capsule   Other Relevant Orders   Lyme Aby, Western Blot IgG & IgM w/bands   CBC w/Diff

## 2015-04-10 NOTE — Assessment & Plan Note (Addendum)
Symptoms and exam consistent with localized Lyme disease currently treated with doxycycline and no evidence of rash. Continue previously prescribed doxycycline. Follow up at completion of therapy for re-evaluation of blood work or sooner if symptoms of progressing Lyme disease present.

## 2015-05-02 ENCOUNTER — Other Ambulatory Visit (INDEPENDENT_AMBULATORY_CARE_PROVIDER_SITE_OTHER): Payer: BLUE CROSS/BLUE SHIELD

## 2015-05-02 ENCOUNTER — Other Ambulatory Visit: Payer: Self-pay

## 2015-05-02 DIAGNOSIS — A6920 Lyme disease, unspecified: Secondary | ICD-10-CM

## 2015-05-02 DIAGNOSIS — R5383 Other fatigue: Secondary | ICD-10-CM | POA: Diagnosis not present

## 2015-05-02 LAB — CBC WITH DIFFERENTIAL/PLATELET
BASOS PCT: 0.3 % (ref 0.0–3.0)
Basophils Absolute: 0 10*3/uL (ref 0.0–0.1)
EOS ABS: 0.1 10*3/uL (ref 0.0–0.7)
Eosinophils Relative: 1.2 % (ref 0.0–5.0)
HCT: 40.1 % (ref 36.0–46.0)
HEMOGLOBIN: 13.5 g/dL (ref 12.0–15.0)
Lymphocytes Relative: 38.1 % (ref 12.0–46.0)
Lymphs Abs: 2.7 10*3/uL (ref 0.7–4.0)
MCHC: 33.7 g/dL (ref 30.0–36.0)
MCV: 92.5 fl (ref 78.0–100.0)
MONO ABS: 0.5 10*3/uL (ref 0.1–1.0)
Monocytes Relative: 7 % (ref 3.0–12.0)
NEUTROS PCT: 53.4 % (ref 43.0–77.0)
Neutro Abs: 3.8 10*3/uL (ref 1.4–7.7)
Platelets: 189 10*3/uL (ref 150.0–400.0)
RBC: 4.34 Mil/uL (ref 3.87–5.11)
RDW: 12.7 % (ref 11.5–15.5)
WBC: 7 10*3/uL (ref 4.0–10.5)

## 2015-05-02 LAB — COMPREHENSIVE METABOLIC PANEL
ALBUMIN: 4.1 g/dL (ref 3.5–5.2)
ALT: 14 U/L (ref 0–35)
AST: 23 U/L (ref 0–37)
Alkaline Phosphatase: 81 U/L (ref 39–117)
BUN: 13 mg/dL (ref 6–23)
CHLORIDE: 99 meq/L (ref 96–112)
CO2: 29 meq/L (ref 19–32)
CREATININE: 0.77 mg/dL (ref 0.40–1.20)
Calcium: 9.1 mg/dL (ref 8.4–10.5)
GFR: 90.45 mL/min (ref >60.00)
GLUCOSE: 88 mg/dL (ref 70–99)
Potassium: 4.1 mEq/L (ref 3.5–5.1)
Sodium: 136 mEq/L (ref 135–145)
Total Bilirubin: 0.5 mg/dL (ref 0.2–1.2)
Total Protein: 7.1 g/dL (ref 6.0–8.3)

## 2015-05-04 ENCOUNTER — Telehealth: Payer: Self-pay | Admitting: Family

## 2015-05-04 LAB — LYME ABY, WSTRN BLT IGG & IGM W/BANDS
B burgdorferi IgG Abs (IB): NEGATIVE
B burgdorferi IgM Abs (IB): NEGATIVE
LYME DISEASE 23 KD IGG: NONREACTIVE
LYME DISEASE 28 KD IGG: NONREACTIVE
LYME DISEASE 58 KD IGG: NONREACTIVE
LYME DISEASE 66 KD IGG: NONREACTIVE
Lyme Disease 18 kD IgG: NONREACTIVE
Lyme Disease 23 kD IgM: NONREACTIVE
Lyme Disease 30 kD IgG: NONREACTIVE
Lyme Disease 39 kD IgG: NONREACTIVE
Lyme Disease 39 kD IgM: NONREACTIVE
Lyme Disease 41 kD IgG: NONREACTIVE
Lyme Disease 41 kD IgM: REACTIVE — AB
Lyme Disease 45 kD IgG: NONREACTIVE
Lyme Disease 93 kD IgG: NONREACTIVE

## 2015-05-04 NOTE — Telephone Encounter (Signed)
Please inform patient that her blood work shows that her kidney function, electrolytes and liver function are all within the normal limits. Also her lyme disease test is also negative at this time indicating that it is not currently active.

## 2015-05-05 NOTE — Telephone Encounter (Signed)
Patient called for status update. Advised of the below note. She has additional questions regarding medication. Please give the patient a call. i am also leaving a copy up front for her to pick up .

## 2015-05-06 NOTE — Telephone Encounter (Signed)
Spoke with pt and she was wanting to know if she had to continue taking doxycycline after finishing her 21 days and also if lyme disease with come back. Spoke with Dr. Jonny RuizJohn and he stated that she should be done with doxycycline and if treated early then it with not come back. Pt aware.

## 2016-05-21 ENCOUNTER — Encounter: Payer: Self-pay | Admitting: Nurse Practitioner

## 2016-05-21 ENCOUNTER — Ambulatory Visit (INDEPENDENT_AMBULATORY_CARE_PROVIDER_SITE_OTHER): Payer: BLUE CROSS/BLUE SHIELD | Admitting: Nurse Practitioner

## 2016-05-21 VITALS — BP 112/82 | HR 86 | Temp 97.6°F | Ht 66.0 in | Wt 129.0 lb

## 2016-05-21 DIAGNOSIS — M25532 Pain in left wrist: Secondary | ICD-10-CM | POA: Diagnosis not present

## 2016-05-21 DIAGNOSIS — M5412 Radiculopathy, cervical region: Secondary | ICD-10-CM

## 2016-05-21 MED ORDER — PREDNISONE 10 MG (21) PO TBPK: 10.0000 mg | ORAL_TABLET | Freq: Every day | ORAL | 0 refills | Status: DC

## 2016-05-21 MED ORDER — CYCLOBENZAPRINE HCL 10 MG PO TABS: 10.0000 mg | ORAL_TABLET | Freq: Every day | ORAL | 0 refills | Status: DC

## 2016-05-21 NOTE — Progress Notes (Signed)
Subjective:  Patient ID: Amber [name], female    DOB: June 27, 1981  Age: 35 y.o. MRN: [id number]  CC: Hand Pain (tingle and numbing in left hand and left foot)   Hand Pain   The incident occurred more than 1 week ago. There was no injury mechanism. The pain is present in the left wrist. The quality of the pain is described as burning (numness and tingling). The pain has been intermittent since the incident. Associated symptoms include numbness and tingling. Pertinent negatives include no chest pain or muscle weakness. The symptoms are aggravated by movement. Treatments tried: reposition of wrist and neck. The treatment provided mild relief.  she is concerned that her symptoms might be related to chronic lyme disease.  Outpatient Medications Prior to Visit  Medication Sig Dispense Refill  . doxycycline (VIBRAMYCIN) 100 MG capsule Take 100 mg by mouth 2 (two) times daily.     No facility-administered medications prior to visit.     ROS See HPI  Objective:  BP 112/82 (BP Location: Left Arm, Patient Position: Sitting, Cuff Size: Normal)   Pulse 86   Temp 97.6 F (36.4 C)   Ht 5\' 6"  (1.676 m)   Wt 129 lb (58.5 kg)   SpO2 98%   BMI 20.82 kg/m   BP Readings from Last 3 Encounters:  05/21/16 112/82  04/10/15 110/72  07/20/14 115/67    Wt Readings from Last 3 Encounters:  05/21/16 129 lb (58.5 kg)  04/10/15 131 lb (59.4 kg)  07/18/14 149 lb (67.6 kg)    Physical Exam  Constitutional: She is oriented to person, place, and time.  Neck: Normal range of motion. Neck supple.  Cardiovascular: Normal rate.   Pulmonary/Chest: Effort normal.  Musculoskeletal: Normal range of motion. She exhibits tenderness. She exhibits no edema.       Left shoulder: Normal.       Left elbow: Normal.       Left wrist: She exhibits tenderness. She exhibits normal range of motion, no bony tenderness, no swelling, no effusion, no crepitus, no deformity and no laceration.       Cervical back:  Normal.       Left upper arm: Normal.       Left forearm: Normal.       Left hand: Normal.  Positive left phalen's test. Tenderness along Lateral aspect of wrist.   Lymphadenopathy:    She has no cervical adenopathy.  Neurological: She is alert and oriented to person, place, and time. She has normal reflexes. No cranial nerve deficit. She exhibits normal muscle tone. Coordination normal.  Skin: Skin is warm and dry. No rash noted. No erythema.  Psychiatric: She has a normal mood and affect. Her behavior is normal.  Vitals reviewed.   Lab Results  Component Value Date   WBC 7.0 05/02/2015   HGB 13.5 05/02/2015   HCT 40.1 05/02/2015   PLT 189.0 05/02/2015   GLUCOSE 88 05/02/2015   ALT 14 05/02/2015   AST 23 05/02/2015   NA 136 05/02/2015   K 4.1 05/02/2015   CL 99 05/02/2015   CREATININE 0.77 05/02/2015   BUN 13 05/02/2015   CO2 29 05/02/2015    No results found.  Assessment & Plan:   Amber [name] was seen today for hand pain.  Diagnoses and all orders for this visit:  Acute pain of left wrist -     predniSONE (STERAPRED UNI-PAK 21 TAB) 10 MG (21) TBPK tablet; Take 1 tablet (10 mg total)  by mouth daily. -     cyclobenzaprine (FLEXERIL) 10 MG tablet; Take 1 tablet (10 mg total) by mouth at bedtime.  Cervical radiculopathy -     cyclobenzaprine (FLEXERIL) 10 MG tablet; Take 1 tablet (10 mg total) by mouth at bedtime.   I have discontinued Amber [name]'s doxycycline. I am also having her start on predniSONE and cyclobenzaprine.  Meds ordered this encounter  Medications  . predniSONE (STERAPRED UNI-PAK 21 TAB) 10 MG (21) TBPK tablet    Sig: Take 1 tablet (10 mg total) by mouth daily.    Dispense:  21 tablet    Refill:  0    Order Specific Question:   Supervising Provider    Answer:   Tresa GarterPLOTNIKOV, ALEKSEI V [[id number]]  . cyclobenzaprine (FLEXERIL) 10 MG tablet    Sig: Take 1 tablet (10 mg total) by mouth at bedtime.    Dispense:  14 tablet    Refill:  0    Order Specific  Question:   Supervising Provider    Answer:   Tresa GarterPLOTNIKOV, ALEKSEI V [[id number]]   Repeat western Blot IgG and IgM 05/02/2015 was negative after completion of 28days of doxycycline.  CMP Latest Ref Rng & Units 05/02/2015 09/06/2010  Glucose 70 - 99 mg/dL 88 88  BUN 6 - 23 mg/dL 13 18  Creatinine 1.610.40 - 1.20 mg/dL 0.960.77 0.451.12  Sodium 409135 - 145 mEq/L 136 136  Potassium 3.5 - 5.1 mEq/L 4.1 3.8  Chloride 96 - 112 mEq/L 99 97  CO2 19 - 32 mEq/L 29 31  Calcium 8.4 - 10.5 mg/dL 9.1 9.6  Total Protein 6.0 - 8.3 g/dL 7.1 7.6  Total Bilirubin 0.2 - 1.2 mg/dL 0.5 0.4  Alkaline Phos 39 - 117 U/L 81 90  AST 0 - 37 U/L 23 32  ALT 0 - 35 U/L 14 26    CBC Latest Ref Rng & Units 05/02/2015 07/19/2014 07/18/2014  WBC 4.0 - 10.5 K/uL 7.0 12.8(H) 10.2  Hemoglobin 12.0 - 15.0 g/dL 81.113.5 11.7(L) 13.1  Hematocrit 36.0 - 46.0 % 40.1 33.9(L) 37.5  Platelets 150.0 - 400.0 K/uL 189.0 150 180   Follow-up: Return if symptoms worsen or fail to improve.  Amber Pennaharlotte Frandy Basnett, NP

## 2016-05-21 NOTE — Progress Notes (Signed)
Pre visit review using our clinic review tool, if applicable. No additional management support is needed unless otherwise documented below in the visit note. 

## 2016-05-21 NOTE — Patient Instructions (Signed)
Wear wrist brace during day an off at night. Will consider referral to neurologist if no improvement in 2week. Will also consider checking western blot-lyme , cbc, tsh, vit b12, esr, and crp if no improvement in 2weeks  [employer] Disease Alfonse Ras disease is inflammation of the tendon on the thumb side of the wrist. Tendons are cords of tissue that connect bones to muscles. The tendons in your hand pass through a tunnel, or sheath. A slippery layer of tissue (synovium) lets the tendons move smoothly in the sheath. With de Quervain disease, the sheath swells or thickens, causing friction and pain. The condition is also called de Quervain tendinosis and de Quervain syndrome. It occurs most often in women who are 36-36 years old. CAUSES  The exact cause of de Quervain disease is not known. It may result from:   Overusing your hands, especially with repetitive motions that involve twisting your hand or using a forceful grip.  Pregnancy.  Rheumatoid disease. RISK FACTORS You may have a greater risk for de Quervain disease if you:  Are a middle-aged woman.  Are pregnant.  Have rheumatoid arthritis.  Have diabetes.  Use your hands far more than normal, especially with a tight grip or excessive twisting. SIGNS AND SYMPTOMS Pain on the thumb side of your wrist is the main symptom of de Quervain disease. Other signs and symptoms include:  Pain that gets worse when you grasp something or turn your wrist.  Pain that extends up the forearm.  Cysts in the area of the pain.  Swelling of your wrist and hand.  A sensation of snapping in the wrist.  Trouble moving the thumb and wrist. DIAGNOSIS  Your health care provider may diagnose de Quervain disease based on your signs and symptoms. A physical exam will also be done. A simple test Wynn Maudlin test) that involves pulling your thumb and wrist to see if this causes pain can help determine whether you have the condition. Sometimes you  may need to have an X-ray.  TREATMENT  Avoiding any activity that causes pain and swelling is the best treatment. Other options include:  Wearing a splint.  Taking medicine. Anti-inflammatory medicines and corticosteroid injections may reduce inflammation and relieve pain.  Having surgery if other treatments do not work. HOME CARE INSTRUCTIONS   Using ice can be helpful after doing activities that involve the sore wrist. To apply ice to the injured area:  Put ice in a plastic bag.  Place a towel between your skin and the bag.  Leave the ice on for 20 minutes, 2-3 times a day.  Take medicines only as directed by your health care provider.  Wear your splint as directed. This will allow your hand to rest and heal. SEEK MEDICAL CARE IF:   Your pain medicine does not help.   Your pain gets worse.  You develop new symptoms. MAKE SURE YOU:   Understand these instructions.  Will watch your condition.  Will get help right away if you are not doing well or get worse.   This information is not intended to replace advice given to you by your health care provider. Make sure you discuss any questions you have with your health care provider.   Document Released: 03/30/2001 Document Revised: 07/26/2014 Document Reviewed: 11/07/2013 Elsevier Interactive Patient Education [employer].

## 2018-08-28 ENCOUNTER — Other Ambulatory Visit: Payer: Self-pay | Admitting: Physician Assistant

## 2018-08-28 MED ORDER — OSELTAMIVIR PHOSPHATE 75 MG PO CAPS: 75.0000 mg | ORAL_CAPSULE | Freq: Two times a day (BID) | ORAL | 0 refills | Status: AC

## 2020-01-02 DIAGNOSIS — Z6821 Body mass index (BMI) 21.0-21.9, adult: Secondary | ICD-10-CM | POA: Diagnosis not present

## 2020-01-02 DIAGNOSIS — Z01419 Encounter for gynecological examination (general) (routine) without abnormal findings: Secondary | ICD-10-CM | POA: Diagnosis not present

## 2020-01-02 DIAGNOSIS — Z1231 Encounter for screening mammogram for malignant neoplasm of breast: Secondary | ICD-10-CM | POA: Diagnosis not present

## 2020-01-04 ENCOUNTER — Other Ambulatory Visit: Payer: Self-pay | Admitting: Obstetrics and Gynecology

## 2020-01-04 DIAGNOSIS — R928 Other abnormal and inconclusive findings on diagnostic imaging of breast: Secondary | ICD-10-CM

## 2020-01-16 ENCOUNTER — Ambulatory Visit: Payer: BLUE CROSS/BLUE SHIELD

## 2020-01-16 ENCOUNTER — Ambulatory Visit
Admission: RE | Admit: 2020-01-16 | Discharge: 2020-01-16 | Disposition: A | Discharge status: Home / Self Care | Payer: BC Managed Care – PPO | Source: Ambulatory Visit | Attending: Obstetrics and Gynecology | Admitting: Obstetrics and Gynecology

## 2020-01-16 ENCOUNTER — Other Ambulatory Visit: Payer: Self-pay

## 2020-01-16 DIAGNOSIS — R922 Inconclusive mammogram: Secondary | ICD-10-CM | POA: Diagnosis not present

## 2020-01-16 DIAGNOSIS — R928 Other abnormal and inconclusive findings on diagnostic imaging of breast: Secondary | ICD-10-CM

## 2020-03-11 DIAGNOSIS — H52203 Unspecified astigmatism, bilateral: Secondary | ICD-10-CM | POA: Diagnosis not present

## 2020-03-11 DIAGNOSIS — H5213 Myopia, bilateral: Secondary | ICD-10-CM | POA: Diagnosis not present

## 2021-01-06 DIAGNOSIS — Z01419 Encounter for gynecological examination (general) (routine) without abnormal findings: Secondary | ICD-10-CM | POA: Diagnosis not present

## 2021-04-06 DIAGNOSIS — H5213 Myopia, bilateral: Secondary | ICD-10-CM | POA: Diagnosis not present

## 2021-04-06 DIAGNOSIS — H52203 Unspecified astigmatism, bilateral: Secondary | ICD-10-CM | POA: Diagnosis not present

## 2021-04-08 DIAGNOSIS — Z1231 Encounter for screening mammogram for malignant neoplasm of breast: Secondary | ICD-10-CM | POA: Diagnosis not present

## 2021-05-18 IMAGING — MG MM DIGITAL DIAGNOSTIC UNILAT*L* W/ TOMO W/ CAD
4 series · 4 of 12 positions shown · non-contrast
Comparison: Baseline mammogram 01/02/2020.

CLINICAL DATA: Recall from baseline screening mammography with
tomosynthesis, possible asymmetry in the retroareolar LEFT breast
visible only on the CC images.

EXAM:
DIGITAL DIAGNOSTIC UNILATERAL LEFT MAMMOGRAM WITH TOMO AND CAD

[L CC synth-2D]
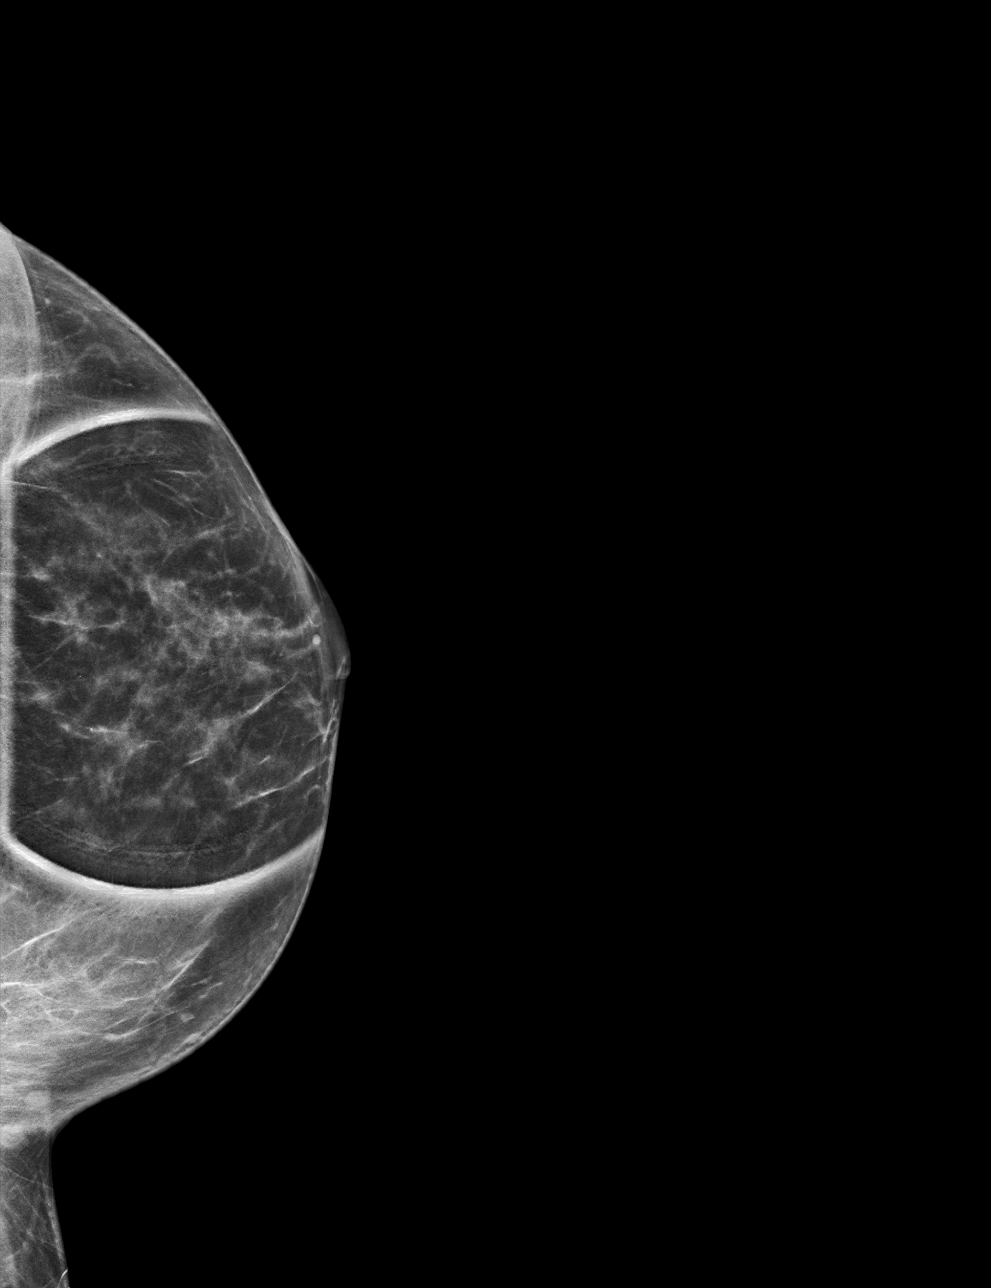

[L ML synth-2D]
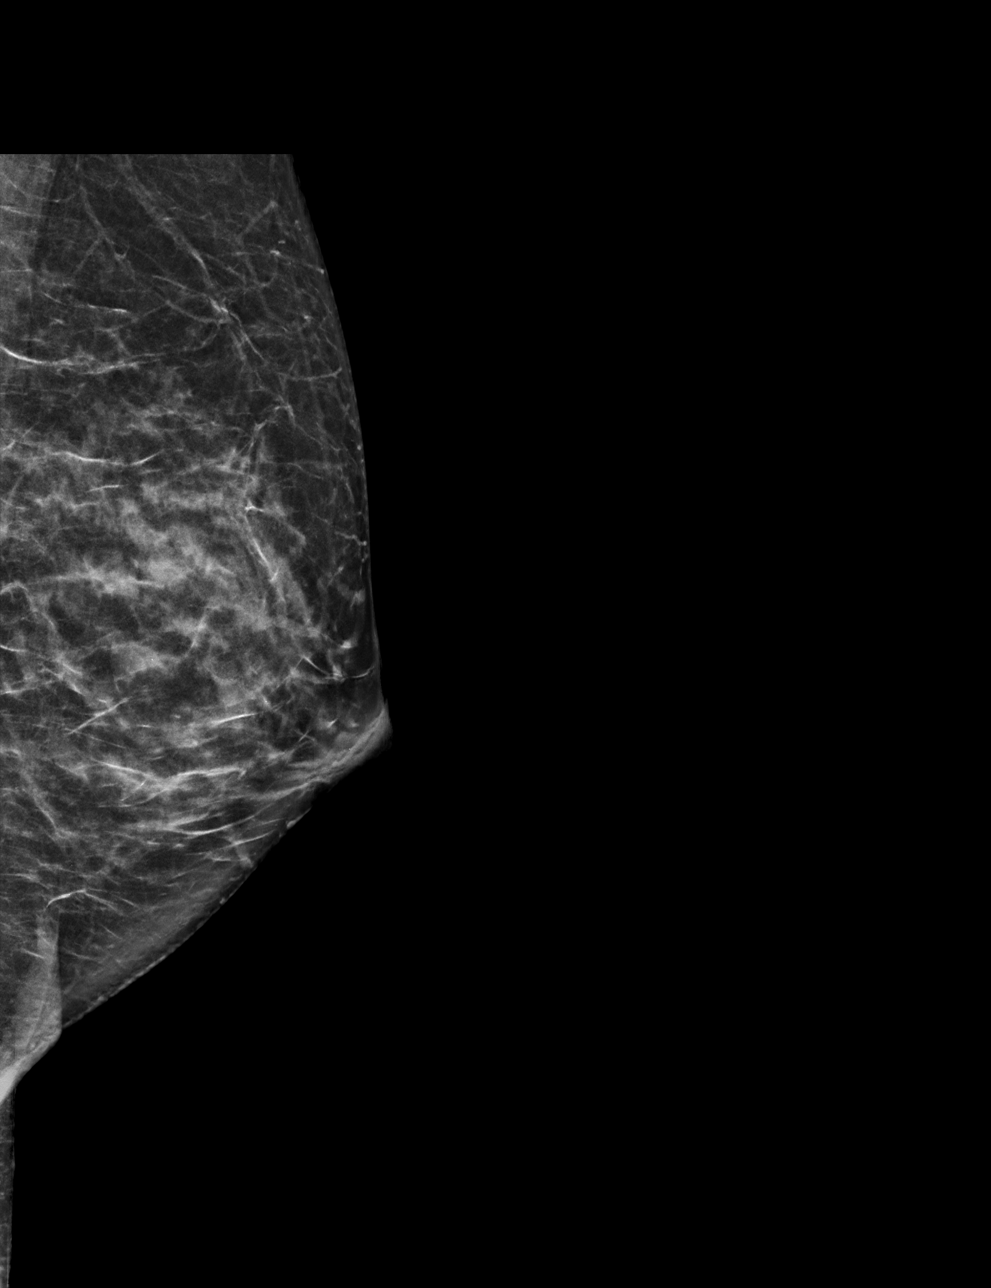

[L CC tomo · tomo slice 27/53.0]
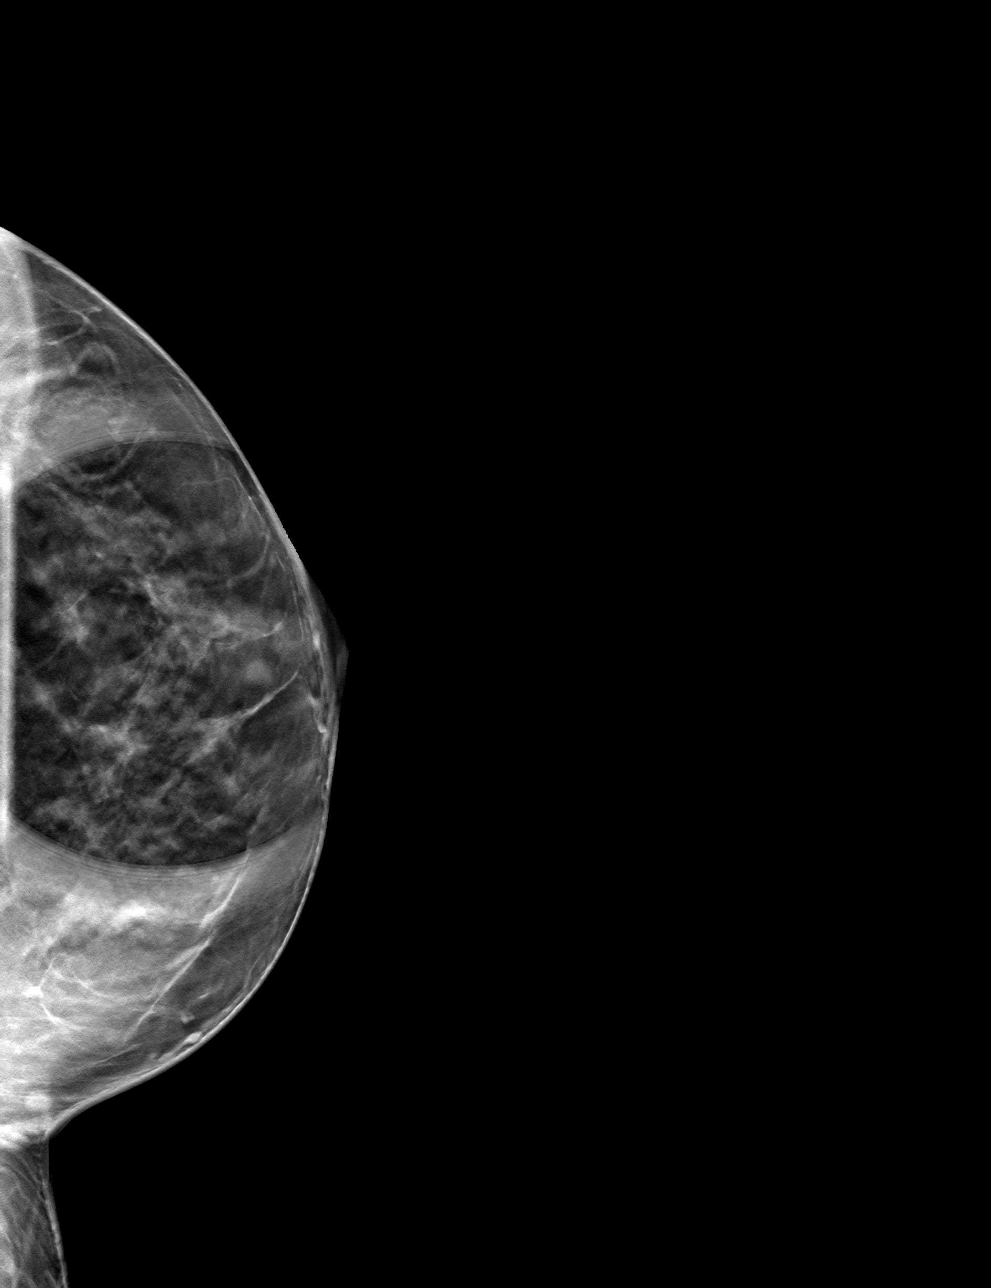

[L ML tomo · tomo slice 31/60.0]
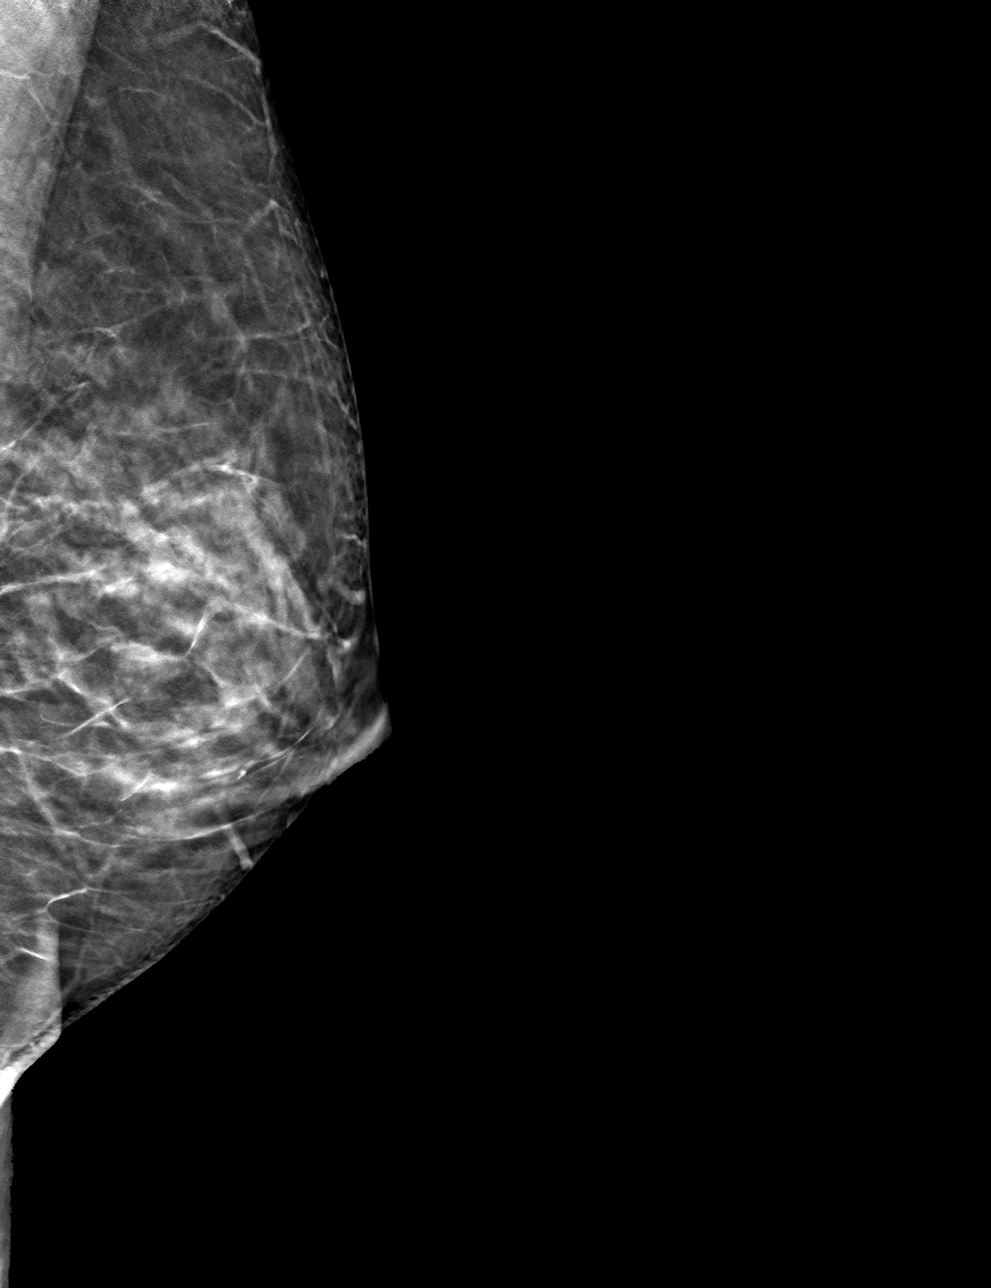

[4 of 12 positions shown; findings below may reference images not displayed]

ACR Breast Density Category c: The breast tissue is heterogeneously
dense, which may obscure small masses.
FINDINGS: Tomosynthesis and synthesized spot-compression CC view of the area
of concern in the LEFT breast and a tomosynthesis and synthesized
full field mediolateral view of the LEFT breast were obtained.

The asymmetry questioned in the retroareolar location disperses with
compression and there is no underlying mass or architectural
distortion.

No findings suspicious for malignancy in the LEFT breast.

The full field mediolateral image was processed with CAD.
IMPRESSION: No mammographic evidence of malignancy involving the LEFT breast.

RECOMMENDATION:
Screening mammogram in one year.(Code:FI-O-PP1)

I have discussed the findings and recommendations with the patient.
If applicable, a reminder letter will be sent to the patient
regarding the next appointment.

BI-RADS CATEGORY  1: Negative.

## 2021-07-24 DIAGNOSIS — Z0000 Encounter for general adult medical examination without abnormal findings: Secondary | ICD-10-CM | POA: Diagnosis not present

## 2021-08-18 DIAGNOSIS — R059 Cough, unspecified: Secondary | ICD-10-CM | POA: Diagnosis not present

## 2021-08-18 DIAGNOSIS — J019 Acute sinusitis, unspecified: Secondary | ICD-10-CM | POA: Diagnosis not present

## 2021-08-18 DIAGNOSIS — R5383 Other fatigue: Secondary | ICD-10-CM | POA: Diagnosis not present

## 2021-08-18 DIAGNOSIS — R0981 Nasal congestion: Secondary | ICD-10-CM | POA: Diagnosis not present

## 2021-09-24 DIAGNOSIS — D225 Melanocytic nevi of trunk: Secondary | ICD-10-CM | POA: Diagnosis not present

## 2021-09-24 DIAGNOSIS — L538 Other specified erythematous conditions: Secondary | ICD-10-CM | POA: Diagnosis not present

## 2021-09-24 DIAGNOSIS — L814 Other melanin hyperpigmentation: Secondary | ICD-10-CM | POA: Diagnosis not present

## 2021-09-24 DIAGNOSIS — L218 Other seborrheic dermatitis: Secondary | ICD-10-CM | POA: Diagnosis not present

## 2021-09-24 DIAGNOSIS — R208 Other disturbances of skin sensation: Secondary | ICD-10-CM | POA: Diagnosis not present

## 2021-09-24 DIAGNOSIS — L821 Other seborrheic keratosis: Secondary | ICD-10-CM | POA: Diagnosis not present

## 2021-09-24 DIAGNOSIS — D492 Neoplasm of unspecified behavior of bone, soft tissue, and skin: Secondary | ICD-10-CM | POA: Diagnosis not present

## 2021-11-10 DIAGNOSIS — J3089 Other allergic rhinitis: Secondary | ICD-10-CM | POA: Diagnosis not present

## 2021-11-10 DIAGNOSIS — H1045 Other chronic allergic conjunctivitis: Secondary | ICD-10-CM | POA: Diagnosis not present

## 2022-04-13 DIAGNOSIS — Z1231 Encounter for screening mammogram for malignant neoplasm of breast: Secondary | ICD-10-CM | POA: Diagnosis not present

## 2022-04-13 DIAGNOSIS — Z01419 Encounter for gynecological examination (general) (routine) without abnormal findings: Secondary | ICD-10-CM | POA: Diagnosis not present

## 2022-04-13 DIAGNOSIS — Z6822 Body mass index (BMI) 22.0-22.9, adult: Secondary | ICD-10-CM | POA: Diagnosis not present

## 2022-04-29 DIAGNOSIS — H52203 Unspecified astigmatism, bilateral: Secondary | ICD-10-CM | POA: Diagnosis not present

## 2022-04-29 DIAGNOSIS — H5213 Myopia, bilateral: Secondary | ICD-10-CM | POA: Diagnosis not present

## 2022-07-13 DIAGNOSIS — R509 Fever, unspecified: Secondary | ICD-10-CM | POA: Diagnosis not present

## 2022-07-13 DIAGNOSIS — R0981 Nasal congestion: Secondary | ICD-10-CM | POA: Diagnosis not present

## 2022-07-13 DIAGNOSIS — J0100 Acute maxillary sinusitis, unspecified: Secondary | ICD-10-CM | POA: Diagnosis not present

## 2022-07-13 DIAGNOSIS — R051 Acute cough: Secondary | ICD-10-CM | POA: Diagnosis not present

## 2022-07-13 DIAGNOSIS — J101 Influenza due to other identified influenza virus with other respiratory manifestations: Secondary | ICD-10-CM | POA: Diagnosis not present

## 2022-07-13 DIAGNOSIS — Z03818 Encounter for observation for suspected exposure to other biological agents ruled out: Secondary | ICD-10-CM | POA: Diagnosis not present

## 2022-09-27 DIAGNOSIS — L814 Other melanin hyperpigmentation: Secondary | ICD-10-CM | POA: Diagnosis not present

## 2022-09-27 DIAGNOSIS — L218 Other seborrheic dermatitis: Secondary | ICD-10-CM | POA: Diagnosis not present

## 2022-09-27 DIAGNOSIS — L821 Other seborrheic keratosis: Secondary | ICD-10-CM | POA: Diagnosis not present

## 2022-09-27 DIAGNOSIS — D225 Melanocytic nevi of trunk: Secondary | ICD-10-CM | POA: Diagnosis not present

## 2022-12-28 ENCOUNTER — Ambulatory Visit: Payer: BC Managed Care – PPO | Admitting: Family Medicine

## 2022-12-30 ENCOUNTER — Encounter: Payer: Self-pay | Admitting: Family Medicine

## 2022-12-30 ENCOUNTER — Ambulatory Visit (INDEPENDENT_AMBULATORY_CARE_PROVIDER_SITE_OTHER): Payer: BC Managed Care – PPO | Admitting: Family Medicine

## 2022-12-30 VITALS — BP 104/70 | HR 67 | Temp 97.8°F | Ht 66.0 in | Wt 141.0 lb

## 2022-12-30 DIAGNOSIS — J309 Allergic rhinitis, unspecified: Secondary | ICD-10-CM | POA: Insufficient documentation

## 2022-12-30 DIAGNOSIS — R5383 Other fatigue: Secondary | ICD-10-CM | POA: Diagnosis not present

## 2022-12-30 DIAGNOSIS — L659 Nonscarring hair loss, unspecified: Secondary | ICD-10-CM | POA: Diagnosis not present

## 2022-12-30 DIAGNOSIS — N939 Abnormal uterine and vaginal bleeding, unspecified: Secondary | ICD-10-CM | POA: Diagnosis not present

## 2022-12-30 DIAGNOSIS — Z7689 Persons encountering health services in other specified circumstances: Secondary | ICD-10-CM

## 2022-12-30 LAB — CBC WITH DIFFERENTIAL/PLATELET
Basophils Absolute: 0 10*3/uL (ref 0.0–0.1)
Basophils Relative: 0.4 % (ref 0.0–3.0)
Eosinophils Absolute: 0 10*3/uL (ref 0.0–0.7)
Eosinophils Relative: 0.5 % (ref 0.0–5.0)
HCT: 40.1 % (ref 36.0–46.0)
Hemoglobin: 13.4 g/dL (ref 12.0–15.0)
Lymphocytes Relative: 30.4 % (ref 12.0–46.0)
Lymphs Abs: 2 10*3/uL (ref 0.7–4.0)
MCHC: 33.3 g/dL (ref 30.0–36.0)
MCV: 98.3 fl (ref 78.0–100.0)
Monocytes Absolute: 0.6 10*3/uL (ref 0.1–1.0)
Monocytes Relative: 8.5 % (ref 3.0–12.0)
Neutro Abs: 4.1 10*3/uL (ref 1.4–7.7)
Neutrophils Relative %: 60.2 % (ref 43.0–77.0)
Platelets: 230 10*3/uL (ref 150.0–400.0)
RBC: 4.08 Mil/uL (ref 3.87–5.11)
RDW: 12.4 % (ref 11.5–15.5)
WBC: 6.7 10*3/uL (ref 4.0–10.5)

## 2022-12-30 LAB — COMPREHENSIVE METABOLIC PANEL
ALT: 14 U/L (ref 0–35)
AST: 22 U/L (ref 0–37)
Albumin: 4.4 g/dL (ref 3.5–5.2)
Alkaline Phosphatase: 54 U/L (ref 39–117)
BUN: 17 mg/dL (ref 6–23)
CO2: 31 mEq/L (ref 19–32)
Calcium: 9.5 mg/dL (ref 8.4–10.5)
Chloride: 101 mEq/L (ref 96–112)
Creatinine, Ser: 0.97 mg/dL (ref 0.40–1.20)
GFR: 71.78 mL/min (ref >60.00)
Glucose, Bld: 98 mg/dL (ref 70–99)
Potassium: 4.2 mEq/L (ref 3.5–5.1)
Sodium: 139 mEq/L (ref 135–145)
Total Bilirubin: 0.5 mg/dL (ref 0.2–1.2)
Total Protein: 7.1 g/dL (ref 6.0–8.3)

## 2022-12-30 LAB — T4, FREE: Free T4: 0.84 ng/dL (ref 0.60–1.60)

## 2022-12-30 LAB — VITAMIN D 25 HYDROXY (VIT D DEFICIENCY, FRACTURES): VITD: 37.18 ng/mL (ref 30.00–100.00)

## 2022-12-30 LAB — TSH: TSH: 1.42 u[IU]/mL (ref 0.35–5.50)

## 2022-12-30 LAB — VITAMIN B12: Vitamin B-12: 214 pg/mL (ref 211–911)

## 2022-12-30 LAB — FOLATE: Folate: 11.1 ng/mL (ref >5.9)

## 2022-12-30 NOTE — Progress Notes (Signed)
New Patient Office Visit  Subjective    Patient ID: Amber [name], female    DOB: October 15, 1980  Age: 42 y.o. MRN: [id number]  CC:  Chief Complaint  Patient presents with   Establish Care    Concerned about hair loss, coming out in clumps. Wants some labwork because using OBGYN for last 8 years    HPI Amber [name] presents to establish care No previous PCP.   Has OB/GYN- Dr. Henderson Cloud   C/o fatigue and hair thinning.  She has tried Biotin.   Has 4 kids.  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    Family hx of Crohns, IBS   Outpatient Encounter Medications as of 12/30/2022  Medication Sig   Multiple Vitamin (MULTIVITAMIN ADULT PO) Multivitamin   [DISCONTINUED] cyclobenzaprine (FLEXERIL) 10 MG tablet Take 1 tablet (10 mg total) by mouth at bedtime.   [DISCONTINUED] predniSONE (STERAPRED UNI-PAK 21 TAB) 10 MG (21) TBPK tablet Take 1 tablet (10 mg total) by mouth daily. (Patient not taking: Reported on 12/30/2022)   No facility-administered encounter medications on file as of 12/30/2022.    Past Medical History:  Diagnosis Date   Abnormal Pap smear    H/O varicella    Vaginal Pap smear, abnormal     Past Surgical History:  Procedure Laterality Date   CRYOTHERAPY     HAND SURGERY  2007   L hand    Family History  Problem Relation Age of Onset   Alcohol abuse Father     Social History   Socioeconomic History   Marital status: Married    Spouse name: Not on file   Number of children: 4   Years of education: 16   Highest education level: Not on file  Occupational History   Occupation: RN  Tobacco Use   Smoking status: Former    Packs/day: 0.75    Years: 10.00    Additional pack years: 0.00    Total pack years: 7.50    Types: Cigarettes   Smokeless tobacco: Never  Substance and Sexual Activity    Alcohol use: Yes    Comment: occasionally   Drug use: No   Sexual activity: Yes    Birth control/protection: None  Other Topics Concern   Not on file  Social History Narrative   Fun: Occupational psychologist.   Denies abuse and feels safe at home.    Social Determinants of Health   Financial Resource Strain: Not on file  Food Insecurity: Not on file  Transportation Needs: Not on file  Physical Activity: Not on file  Stress: Not on file  Social Connections: Not on file  Intimate Partner Violence: Not on file    Review of Systems  Constitutional:  Positive for malaise/fatigue. Negative for chills, fever and weight loss.  Respiratory:  Negative for shortness of breath.   Cardiovascular:  Negative for chest pain, palpitations and leg swelling.  Gastrointestinal:  Negative for abdominal pain, constipation, diarrhea, nausea and vomiting.  Genitourinary:  Negative for dysuria, frequency and urgency.  Musculoskeletal:  Negative for joint pain, myalgias and neck pain.  Skin:  Negative for rash.  Neurological:  Negative for dizziness, focal weakness and headaches.  Psychiatric/Behavioral:  Negative for depression. The patient is not nervous/anxious.  Objective    BP 104/70 (BP Location: Left Arm, Patient Position: Sitting, Cuff Size: Large)   Pulse 67   Temp 97.8 F (36.6 C) (Temporal)   Ht 5\' 6"  (1.676 m)   Wt 141 lb (64 kg)   SpO2 99%   BMI 22.76 kg/m   Physical Exam Constitutional:      General: She is not in acute distress.    Appearance: She is not ill-appearing.  HENT:     Head: Atraumatic.     Mouth/Throat:     Mouth: Mucous membranes are moist.  Eyes:     Extraocular Movements: Extraocular movements intact.     Conjunctiva/sclera: Conjunctivae normal.  Cardiovascular:     Rate and Rhythm: Normal rate and regular rhythm.  Pulmonary:     Effort: Pulmonary effort is normal.     Breath sounds: Normal breath sounds.  Musculoskeletal:        General: Normal  range of motion.     Cervical back: Normal range of motion and neck supple. No tenderness.  Lymphadenopathy:     Cervical: No cervical adenopathy.  Skin:    General: Skin is warm and dry.     Findings: No rash.  Neurological:     General: No focal deficit present.     Mental Status: She is alert and oriented to person, place, and time.     Cranial Nerves: No cranial nerve deficit.     Motor: No weakness.     Coordination: Coordination normal.  Psychiatric:        Mood and Affect: Mood normal.        Behavior: Behavior normal.        Thought Content: Thought content normal.         Assessment & Plan:   Problem List Items Addressed This Visit   None Visit Diagnoses     Hair thinning    -  Primary   Relevant Orders   Iron, TIBC and Ferritin Panel   Vitamin B12 (Completed)   T4, free (Completed)   TSH (Completed)   Zinc   Folate (Completed)   Encounter to establish care       Fatigue, unspecified type       Relevant Orders   CBC with Differential/Platelet (Completed)   Comprehensive metabolic panel (Completed)   Iron, TIBC and Ferritin Panel   Vitamin B12 (Completed)   VITAMIN D 25 Hydroxy (Vit-D Deficiency, Fractures) (Completed)   T4, free (Completed)   TSH (Completed)   Folate (Completed)   Abnormal uterine bleeding (AUB)       Relevant Orders   CBC with Differential/Platelet (Completed)   Comprehensive metabolic panel (Completed)   Iron, TIBC and Ferritin Panel   Vitamin B12 (Completed)   T4, free (Completed)   TSH (Completed)      She I here to establish care.  Check labs to look for underling etiology for fatigue and hair thinning.  Follow up with OB/GYN.   Return for pending labs.   Hetty Blend, NP-C

## 2022-12-30 NOTE — Patient Instructions (Addendum)
Thank you for trusting [place] with your health care.  Please go downstairs for labs before you leave.  Consider taking a prenatal vitamin.    We will be in touch with your results and recommendations.

## 2023-01-03 LAB — IRON,TIBC AND FERRITIN PANEL
%SAT: 90 % (calc) — ABNORMAL HIGH (ref 16–45)
Ferritin: 132 ng/mL (ref 16–232)
Iron: 237 ug/dL — ABNORMAL HIGH (ref 40–190)
TIBC: 264 mcg/dL (calc) (ref 250–450)

## 2023-01-03 LAB — ZINC: Zinc: 59 ug/dL — ABNORMAL LOW (ref 60–130)

## 2023-04-04 DIAGNOSIS — H5213 Myopia, bilateral: Secondary | ICD-10-CM | POA: Diagnosis not present

## 2023-04-19 DIAGNOSIS — Z1231 Encounter for screening mammogram for malignant neoplasm of breast: Secondary | ICD-10-CM | POA: Diagnosis not present

## 2023-04-19 DIAGNOSIS — Z124 Encounter for screening for malignant neoplasm of cervix: Secondary | ICD-10-CM | POA: Diagnosis not present

## 2023-04-19 DIAGNOSIS — Z01419 Encounter for gynecological examination (general) (routine) without abnormal findings: Secondary | ICD-10-CM | POA: Diagnosis not present

## 2023-04-19 DIAGNOSIS — Z6822 Body mass index (BMI) 22.0-22.9, adult: Secondary | ICD-10-CM | POA: Diagnosis not present

## 2023-10-11 DIAGNOSIS — L821 Other seborrheic keratosis: Secondary | ICD-10-CM | POA: Diagnosis not present

## 2023-10-11 DIAGNOSIS — L814 Other melanin hyperpigmentation: Secondary | ICD-10-CM | POA: Diagnosis not present

## 2023-10-11 DIAGNOSIS — D485 Neoplasm of uncertain behavior of skin: Secondary | ICD-10-CM | POA: Diagnosis not present

## 2023-10-11 DIAGNOSIS — D2372 Other benign neoplasm of skin of left lower limb, including hip: Secondary | ICD-10-CM | POA: Diagnosis not present

## 2023-11-24 ENCOUNTER — Encounter (HOSPITAL_COMMUNITY): Payer: Self-pay

## 2023-12-01 ENCOUNTER — Encounter: Payer: Self-pay | Admitting: Family Medicine

## 2023-12-01 ENCOUNTER — Ambulatory Visit: Admitting: Family Medicine

## 2023-12-01 VITALS — BP 116/72 | HR 75 | Temp 97.9°F | Ht 66.0 in | Wt 138.0 lb

## 2023-12-01 DIAGNOSIS — Z13220 Encounter for screening for lipoid disorders: Secondary | ICD-10-CM | POA: Diagnosis not present

## 2023-12-01 DIAGNOSIS — J309 Allergic rhinitis, unspecified: Secondary | ICD-10-CM | POA: Diagnosis not present

## 2023-12-01 DIAGNOSIS — H6592 Unspecified nonsuppurative otitis media, left ear: Secondary | ICD-10-CM | POA: Diagnosis not present

## 2023-12-01 DIAGNOSIS — R519 Headache, unspecified: Secondary | ICD-10-CM

## 2023-12-01 DIAGNOSIS — E538 Deficiency of other specified B group vitamins: Secondary | ICD-10-CM

## 2023-12-01 DIAGNOSIS — H1045 Other chronic allergic conjunctivitis: Secondary | ICD-10-CM | POA: Insufficient documentation

## 2023-12-01 DIAGNOSIS — R5383 Other fatigue: Secondary | ICD-10-CM | POA: Diagnosis not present

## 2023-12-01 DIAGNOSIS — E60 Dietary zinc deficiency: Secondary | ICD-10-CM

## 2023-12-01 DIAGNOSIS — Z0000 Encounter for general adult medical examination without abnormal findings: Secondary | ICD-10-CM | POA: Diagnosis not present

## 2023-12-01 DIAGNOSIS — O289 Unspecified abnormal findings on antenatal screening of mother: Secondary | ICD-10-CM | POA: Insufficient documentation

## 2023-12-01 DIAGNOSIS — Z0001 Encounter for general adult medical examination with abnormal findings: Secondary | ICD-10-CM

## 2023-12-01 NOTE — Progress Notes (Signed)
 Subjective:     Patient ID: Amber [name], female    DOB: 02-02-1981, 43 y.o.   MRN: [id number]  Chief Complaint  Patient presents with   Headache    Frequent headaches, mostly behind left eye   Fatigue    Wants baseline labs to check and see     HPI  History of Present Illness         She would like fasting labs.  CPE overdue. She sees her OB/GYN.  C/o left sided frontal headache, behind her left eye for the past 5-6 months. Pain is sharp and dull. Triggers are stress.  Ibuprofen  helps 1-2 days per week.    Fatigue worse in the afternoons.  Does not snore.     Health Maintenance Due  Topic Date Due   Hepatitis C Screening  Never done   DTaP/Tdap/Td (1 - Tdap) Never done   Cervical Cancer Screening (HPV/Pap Cotest)  Never done    Past Medical History:  Diagnosis Date   Abnormal Pap smear    H/O varicella    Vaginal Pap smear, abnormal     Past Surgical History:  Procedure Laterality Date   CRYOTHERAPY     HAND SURGERY  2007   L hand    Family History  Problem Relation Age of Onset   Alcohol abuse Father     Social History   Socioeconomic History   Marital status: Married    Spouse name: Not on file   Number of children: 4   Years of education: 16   Highest education level: Not on file  Occupational History   Occupation: RN  Tobacco Use   Smoking status: Former    Current packs/day: 0.75    Average packs/day: 0.8 packs/day for 10.0 years (7.5 ttl pk-yrs)    Types: Cigarettes   Smokeless tobacco: Never  Substance and Sexual Activity   Alcohol use: Yes    Comment: occasionally   Drug use: No   Sexual activity: Yes    Birth control/protection: None  Other Topics Concern   Not on file  Social History Narrative   Fun: Occupational psychologist.   Denies abuse and feels safe at home.    Social Drivers of Corporate investment banker Strain: Not on file  Food Insecurity: Not on file  Transportation Needs: Not on file  Physical Activity:  Not on file  Stress: Not on file  Social Connections: Not on file  Intimate Partner Violence: Not on file    Outpatient Medications Prior to Visit  Medication Sig Dispense Refill   Multiple Vitamin (MULTIVITAMIN ADULT PO) Multivitamin     No facility-administered medications prior to visit.    No Known Allergies  Review of Systems  Constitutional:  Positive for malaise/fatigue. Negative for chills and fever.  HENT:  Negative for congestion, ear pain, sinus pain and sore throat.   Eyes:  Negative for blurred vision, double vision and pain.  Respiratory:  Negative for cough, shortness of breath and wheezing.   Cardiovascular:  Negative for chest pain, palpitations and leg swelling.  Gastrointestinal:  Negative for abdominal pain, constipation, diarrhea, nausea and vomiting.  Genitourinary:  Negative for dysuria, frequency and urgency.  Musculoskeletal:  Negative for back pain, joint pain and myalgias.  Skin:  Negative for rash.  Neurological:  Positive for headaches. Negative for dizziness, tingling and focal weakness.  Psychiatric/Behavioral:  Negative for depression. The patient is not nervous/anxious.        Objective:  Physical Exam Constitutional:      General: She is not in acute distress.    Appearance: She is not ill-appearing.  HENT:     Right Ear: Tympanic membrane, ear canal and external ear normal.     Left Ear: Tympanic membrane, ear canal and external ear normal.     Nose: Nose normal.     Mouth/Throat:     Mouth: Mucous membranes are moist.     Pharynx: Oropharynx is clear.  Eyes:     Extraocular Movements: Extraocular movements intact.     Conjunctiva/sclera: Conjunctivae normal.     Pupils: Pupils are equal, round, and reactive to light.  Neck:     Thyroid: No thyroid mass, thyromegaly or thyroid tenderness.  Cardiovascular:     Rate and Rhythm: Normal rate and regular rhythm.     Pulses: Normal pulses.     Heart sounds: Normal heart sounds.   Pulmonary:     Effort: Pulmonary effort is normal.     Breath sounds: Normal breath sounds.  Abdominal:     General: Bowel sounds are normal.     Palpations: Abdomen is soft.     Tenderness: There is no abdominal tenderness. There is no right CVA tenderness, left CVA tenderness, guarding or rebound.  Musculoskeletal:        General: Normal range of motion.     Cervical back: Normal range of motion and neck supple. No tenderness.     Right lower leg: No edema.     Left lower leg: No edema.  Lymphadenopathy:     Cervical: No cervical adenopathy.  Skin:    General: Skin is warm and dry.     Findings: No lesion or rash.  Neurological:     General: No focal deficit present.     Mental Status: She is alert and oriented to person, place, and time.     Cranial Nerves: No cranial nerve deficit.     Sensory: No sensory deficit.     Motor: No weakness.     Gait: Gait normal.  Psychiatric:        Mood and Affect: Mood normal.        Behavior: Behavior normal.        Thought Content: Thought content normal.      BP 116/72 (BP Location: Left Arm, Patient Position: Sitting)   Pulse 75   Temp 97.9 F (36.6 C) (Temporal)   Ht 5\' 6"  (1.676 m)   Wt 138 lb (62.6 kg)   SpO2 95%   BMI 22.27 kg/m  Wt Readings from Last 3 Encounters:  12/01/23 138 lb (62.6 kg)  12/30/22 141 lb (64 kg)  05/21/16 129 lb (58.5 kg)       Assessment & Plan:   Problem List Items Addressed This Visit     Allergic rhinitis   Take nondrowsy antihistamine and use Flonase nasal spray to see if symptoms improve.  May be contributing to headache      B12 deficiency   Check vitamin B12 level and follow-up      Relevant Orders   Vitamin B12   Encounter for general adult medical examination with abnormal findings - Primary   Preventive health care reviewed.  Counseling on healthy lifestyle including diet and exercise.  Recommend regular dental and eye exams.  Immunizations reviewed.  Discussed  safety. Breast and pelvic exam with OB/GYN       Fatigue   May be multifactorial.  Check labs to look  for underlying etiology.  Treat allergies.  Unlikely sleep apnea      Relevant Orders   CBC with Differential/Platelet   Comprehensive metabolic panel with GFR   Ferritin   TSH   T4, free   Vitamin B12   VITAMIN D  25 Hydroxy (Vit-D Deficiency, Fractures)   Left-sided headache   Recommend treating allergies to see if symptoms improve.  I also recommend that she see her eye doctor soon for an evaluation.  No red flag symptoms.  No need for imaging.  Start magnesium .  Avoid fluctuations in caffeine.  Get regular sleep.  Stay hydrated.  Recommend that she keep a headache journal and follow-up if worsening or new symptoms.      Relevant Orders   Magnesium    OME (otitis media with effusion), left   Treat with Flonase and Xyzal      Zinc  deficiency   Check zinc  level and follow-up      Relevant Orders   Zinc    Other Visit Diagnoses       Screening for lipid disorders       Relevant Orders   Lipid panel       I am having Drue L. Cranmore maintain her Multiple Vitamin (MULTIVITAMIN ADULT PO).  No orders of the defined types were placed in this encounter.

## 2023-12-01 NOTE — Patient Instructions (Signed)
 Please return for fasting labs tomorrow morning.  Come in well-hydrated.  Start magnesium  400 mg daily.  Keep a headache journal.  Use fluticasone nasal spray daily as well as Xyzal daily for the next 2 to 4 weeks to see if your symptoms improve.  Try to increase your physical activity, improve hydration and be careful with fluctuations in caffeine.  I will be in touch with your results and with recommendations.

## 2023-12-02 ENCOUNTER — Other Ambulatory Visit (INDEPENDENT_AMBULATORY_CARE_PROVIDER_SITE_OTHER)

## 2023-12-02 DIAGNOSIS — R519 Headache, unspecified: Secondary | ICD-10-CM | POA: Diagnosis not present

## 2023-12-02 DIAGNOSIS — E538 Deficiency of other specified B group vitamins: Secondary | ICD-10-CM

## 2023-12-02 DIAGNOSIS — E60 Dietary zinc deficiency: Secondary | ICD-10-CM

## 2023-12-02 DIAGNOSIS — R5383 Other fatigue: Secondary | ICD-10-CM

## 2023-12-02 DIAGNOSIS — Z13220 Encounter for screening for lipoid disorders: Secondary | ICD-10-CM

## 2023-12-02 LAB — COMPREHENSIVE METABOLIC PANEL WITH GFR
ALT: 12 U/L (ref 0–35)
AST: 22 U/L (ref 0–37)
Albumin: 4.4 g/dL (ref 3.5–5.2)
Alkaline Phosphatase: 60 U/L (ref 39–117)
BUN: 10 mg/dL (ref 6–23)
CO2: 30 meq/L (ref 19–32)
Calcium: 9.3 mg/dL (ref 8.4–10.5)
Chloride: 99 meq/L (ref 96–112)
Creatinine, Ser: 0.9 mg/dL (ref 0.40–1.20)
GFR: 78.02 mL/min (ref >60.00)
Glucose, Bld: 97 mg/dL (ref 70–99)
Potassium: 4.3 meq/L (ref 3.5–5.1)
Sodium: 136 meq/L (ref 135–145)
Total Bilirubin: 1.2 mg/dL (ref 0.2–1.2)
Total Protein: 7.1 g/dL (ref 6.0–8.3)

## 2023-12-02 LAB — CBC WITH DIFFERENTIAL/PLATELET
Basophils Absolute: 0 10*3/uL (ref 0.0–0.1)
Basophils Relative: 0.7 % (ref 0.0–3.0)
Eosinophils Absolute: 0 10*3/uL (ref 0.0–0.7)
Eosinophils Relative: 0.5 % (ref 0.0–5.0)
HCT: 42.9 % (ref 36.0–46.0)
Hemoglobin: 14.5 g/dL (ref 12.0–15.0)
Lymphocytes Relative: 27.9 % (ref 12.0–46.0)
Lymphs Abs: 1.7 10*3/uL (ref 0.7–4.0)
MCHC: 33.8 g/dL (ref 30.0–36.0)
MCV: 96.5 fl (ref 78.0–100.0)
Monocytes Absolute: 0.5 10*3/uL (ref 0.1–1.0)
Monocytes Relative: 7.5 % (ref 3.0–12.0)
Neutro Abs: 3.9 10*3/uL (ref 1.4–7.7)
Neutrophils Relative %: 63.4 % (ref 43.0–77.0)
Platelets: 243 10*3/uL (ref 150.0–400.0)
RBC: 4.44 Mil/uL (ref 3.87–5.11)
RDW: 12.5 % (ref 11.5–15.5)
WBC: 6.2 10*3/uL (ref 4.0–10.5)

## 2023-12-02 LAB — LIPID PANEL
Cholesterol: 180 mg/dL (ref 0–200)
HDL: 74.7 mg/dL (ref >39.00)
LDL Cholesterol: 91 mg/dL (ref 0–99)
NonHDL: 105.47
Total CHOL/HDL Ratio: 2
Triglycerides: 70 mg/dL (ref 0.0–149.0)
VLDL: 14 mg/dL (ref 0.0–40.0)

## 2023-12-02 LAB — MAGNESIUM: Magnesium: 2 mg/dL (ref 1.5–2.5)

## 2023-12-05 DIAGNOSIS — Z0001 Encounter for general adult medical examination with abnormal findings: Secondary | ICD-10-CM | POA: Insufficient documentation

## 2023-12-05 DIAGNOSIS — H6592 Unspecified nonsuppurative otitis media, left ear: Secondary | ICD-10-CM | POA: Insufficient documentation

## 2023-12-05 DIAGNOSIS — R519 Headache, unspecified: Secondary | ICD-10-CM | POA: Insufficient documentation

## 2023-12-05 DIAGNOSIS — E538 Deficiency of other specified B group vitamins: Secondary | ICD-10-CM | POA: Insufficient documentation

## 2023-12-05 DIAGNOSIS — E60 Dietary zinc deficiency: Secondary | ICD-10-CM | POA: Insufficient documentation

## 2023-12-05 DIAGNOSIS — R5383 Other fatigue: Secondary | ICD-10-CM | POA: Insufficient documentation

## 2023-12-05 NOTE — Assessment & Plan Note (Signed)
 Check zinc  level and follow-up

## 2023-12-05 NOTE — Assessment & Plan Note (Signed)
 Preventive health care reviewed.  Counseling on healthy lifestyle including diet and exercise.  Recommend regular dental and eye exams.  Immunizations reviewed.  Discussed safety. Breast and pelvic exam with OB/GYN

## 2023-12-05 NOTE — Assessment & Plan Note (Signed)
 Treat with Flonase and Xyzal

## 2023-12-05 NOTE — Assessment & Plan Note (Signed)
Check vitamin B12 level and follow-up

## 2023-12-05 NOTE — Assessment & Plan Note (Signed)
 Take nondrowsy antihistamine and use Flonase nasal spray to see if symptoms improve.  May be contributing to headache

## 2023-12-05 NOTE — Assessment & Plan Note (Signed)
 May be multifactorial.  Check labs to look for underlying etiology.  Treat allergies.  Unlikely sleep apnea

## 2023-12-05 NOTE — Assessment & Plan Note (Signed)
 Recommend treating allergies to see if symptoms improve.  I also recommend that she see her eye doctor soon for an evaluation.  No red flag symptoms.  No need for imaging.  Start magnesium .  Avoid fluctuations in caffeine.  Get regular sleep.  Stay hydrated.  Recommend that she keep a headache journal and follow-up if worsening or new symptoms.

## 2023-12-06 ENCOUNTER — Ambulatory Visit: Payer: Self-pay | Admitting: Family Medicine

## 2023-12-06 LAB — VITAMIN D 25 HYDROXY (VIT D DEFICIENCY, FRACTURES): VITD: 35.58 ng/mL (ref 30.00–100.00)

## 2023-12-06 LAB — T4, FREE: Free T4: 0.95 ng/dL (ref 0.60–1.60)

## 2023-12-06 LAB — VITAMIN B12: Vitamin B-12: 159 pg/mL — ABNORMAL LOW (ref 211–911)

## 2023-12-06 LAB — ZINC: Zinc: 141 ug/dL — ABNORMAL HIGH (ref 60–130)

## 2023-12-06 LAB — FERRITIN: Ferritin: 224 ng/mL (ref 10.0–291.0)

## 2023-12-06 LAB — TSH: TSH: 1.76 u[IU]/mL (ref 0.35–5.50)

## 2023-12-06 NOTE — Progress Notes (Signed)
 Her vitamin B12 is low.  I recommend once weekly B12 injections and then monthly after that.  I also recommend that she start taking an over-the-counter vitamin B12 1000 mcg daily.  Her zinc  level is now high.  I recommend that she reduce her current zinc  supplement by half.  Vitamin D  level is low normal.  I recommend that she increase her vitamin D  supplement over-the-counter by 1000 IUs daily.

## 2023-12-07 ENCOUNTER — Ambulatory Visit (INDEPENDENT_AMBULATORY_CARE_PROVIDER_SITE_OTHER)

## 2023-12-07 ENCOUNTER — Telehealth: Payer: Self-pay

## 2023-12-07 ENCOUNTER — Other Ambulatory Visit: Payer: Self-pay

## 2023-12-07 DIAGNOSIS — E538 Deficiency of other specified B group vitamins: Secondary | ICD-10-CM

## 2023-12-07 MED ORDER — CYANOCOBALAMIN 1000 MCG/ML IJ SOLN
1000.0000 ug | Freq: Once | INTRAMUSCULAR | Status: AC
Start: 2023-12-07 — End: 2023-12-07
  Administered 2023-12-07: 1000 ug via INTRAMUSCULAR

## 2023-12-07 NOTE — Progress Notes (Signed)
 After obtaining consent, and per orders of [facility] NP-C , injection of B12 given by Ferdie Ping. Patient instructed to report any adverse reaction to me immediately.

## 2023-12-07 NOTE — Telephone Encounter (Signed)
 Pt is wanting her B12 injection send to the pharmacy, so that she can administer

## 2023-12-07 NOTE — Telephone Encounter (Signed)
 Pt requesting that an order for her B12 injection should be send to the pharmacy, so that she can administer it her self.

## 2023-12-08 MED ORDER — BD SAFETYGLIDE SYRINGE/NEEDLE 25G X 1" 3 ML MISC: 1 refills | Status: DC

## 2023-12-08 MED ORDER — CYANOCOBALAMIN 1000 MCG/ML IJ SOLN
1000.0000 ug | INTRAMUSCULAR | 1 refills | Status: DC
Start: 2023-12-08 — End: 2023-12-29

## 2023-12-09 ENCOUNTER — Ambulatory Visit: Payer: Self-pay

## 2023-12-09 ENCOUNTER — Other Ambulatory Visit: Payer: Self-pay | Admitting: Family Medicine

## 2023-12-09 MED ORDER — AMOXICILLIN 875 MG PO TABS: 875.0000 mg | ORAL_TABLET | Freq: Two times a day (BID) | ORAL | 0 refills | Status: AC

## 2023-12-09 NOTE — Telephone Encounter (Signed)
 Called pt and informed of rx sent and of recommendations. Pt verbalized understanding

## 2023-12-09 NOTE — Telephone Encounter (Addendum)
 Copied from CRM [id number]. Topic: Clinical - Medication Question >> Dec 09, 2023  8:36 AM Amber [name] wrote: Reason for CRM: Patient states she was seen in office last week for fluid behind left ear and it wasn't infected, now it is and wants to know if Amber [name] can  send in the prescription for the antibiotics that they talked about to the [facility] on file on [address]  [name] [phone]  First attempt made; no answer.  Second attempt made; no answer

## 2023-12-09 NOTE — Telephone Encounter (Signed)
 Chief Complaint: Left ear pressure  Symptoms: Pressure through sinuses on left side, yellow discharge from nose, feels full, left jaw hurts from pressure Pertinent Negatives: Patient denies fever, ear pain  Disposition: [x]  Follow-up with PCP  Additional Notes: Patient was seen in office by Alyson Back, NP, on 5/15. Pt is requesting an antibiotic and/or steroid be called in since she is now experiencing what she thinks is an infection in her left ear. Pt states she is going out of town for the weekend so would like this called in as soon as possible. Pt pharmacy is below:  [id number] - [place], [place] - [address] AT [facility] [address] & [facility] [address]   Reason for Disposition  Pus or cloudy discharge from ear canal  Answer Assessment - Initial Assessment Questions 1. LOCATION: "Which ear is involved?"       Left ear 2. SENSATION: "Describe how the ear feels." (e.g. stuffy, full, plugged)."      Full 3. ONSET:  "When did the ear symptoms start?"       Yesterday and progressively has gotten worse 4. PAIN: "Do you also have an earache?" If Yes, ask: "How bad is it?" (Scale 1-10; or mild, moderate, severe)     No  Protocols used: Ear - Congestion-A-AH

## 2023-12-15 ENCOUNTER — Ambulatory Visit (INDEPENDENT_AMBULATORY_CARE_PROVIDER_SITE_OTHER)

## 2023-12-15 VITALS — Ht 66.0 in

## 2023-12-15 DIAGNOSIS — E538 Deficiency of other specified B group vitamins: Secondary | ICD-10-CM | POA: Diagnosis not present

## 2023-12-15 MED ORDER — CYANOCOBALAMIN 1000 MCG/ML IJ SOLN
1000.0000 ug | Freq: Once | INTRAMUSCULAR | Status: AC
  Administered 2023-12-15: 1000 ug via INTRAMUSCULAR

## 2023-12-15 NOTE — Progress Notes (Addendum)
 Pt here for weekly B12 injection per Vickie Henson NP-C  B12 1000mcg given IM and pt tolerated injection well.  Next B12 injection scheduled

## 2023-12-21 ENCOUNTER — Ambulatory Visit (INDEPENDENT_AMBULATORY_CARE_PROVIDER_SITE_OTHER)

## 2023-12-21 DIAGNOSIS — E538 Deficiency of other specified B group vitamins: Secondary | ICD-10-CM | POA: Diagnosis not present

## 2023-12-21 MED ORDER — CYANOCOBALAMIN 1000 MCG/ML IJ SOLN
1000.0000 ug | Freq: Once | INTRAMUSCULAR | Status: AC
  Administered 2023-12-21: 1000 ug via INTRAMUSCULAR

## 2023-12-21 NOTE — Progress Notes (Signed)
 Pt here for weekly B12 injection per Hetty Blend  B12 given IM and pt tolerated injection well.

## 2023-12-27 ENCOUNTER — Telehealth: Payer: Self-pay | Admitting: Family Medicine

## 2023-12-27 DIAGNOSIS — E538 Deficiency of other specified B group vitamins: Secondary | ICD-10-CM

## 2023-12-27 NOTE — Telephone Encounter (Unsigned)
 Copied from CRM [id number]. Topic: Clinical - Medication Question >> Dec 27, 2023  2:06 PM Martinique E wrote: Reason for CRM: Patient stated she is now able to do at-home injections of vitamin B12, and questioning if PCP would be able to send next week's dose in. Callback number for patient is [phone].

## 2023-12-29 ENCOUNTER — Ambulatory Visit

## 2023-12-29 MED ORDER — CYANOCOBALAMIN 1000 MCG/ML IJ SOLN: INTRAMUSCULAR | 6 refills | Status: AC

## 2023-12-29 MED ORDER — BD SAFETYGLIDE SYRINGE/NEEDLE 25G X 1" 3 ML MISC: 0 refills | Status: AC

## 2023-12-29 NOTE — Telephone Encounter (Signed)
 Rx sent.

## 2024-01-05 NOTE — Addendum Note (Signed)
 Addended by: Gweneth Lenis on: 01/05/2024 08:38 AM   Modules accepted: Orders

## 2024-01-05 NOTE — Telephone Encounter (Unsigned)
 Copied from CRM [id number]. Topic: Clinical - Medication Refill >> Jan 05, 2024  8:21 AM Freya Jesus wrote: Medication: cyanocobalamin  (VITAMIN B12) 1000 MCG/ML injection [[id number]]  Has the patient contacted their pharmacy? No (Agent: If no, request that the patient contact the pharmacy for the refill. If patient does not wish to contact the pharmacy document the reason why and proceed with request.) (Agent: If yes, when and what did the pharmacy advise?)  This is the patient's preferred pharmacy:  [id number] [place], [place] - [address] AT [facility] OF [facility] [address] & [facility] [address] [place] [address] Phone: [phone] Fax: [phone]   Is this the correct pharmacy for this prescription? Yes If no, delete pharmacy and type the correct one.   Has the prescription been filled recently? Yes  Is the patient out of the medication? Yes  Has the patient been seen for an appointment in the last year OR does the patient have an upcoming appointment? Yes  Can we respond through MyChart? Yes  Agent: Please be advised that Rx refills may take up to 3 business days. We ask that you follow-up with your pharmacy.

## 2024-01-10 NOTE — Telephone Encounter (Signed)
 Copied from CRM [id number]. Topic: Clinical - Medication Question >> Jan 10, 2024  8:20 AM Henretta I wrote: Reason for CRM: Patient stated she would like her B-12 shot to be sent over to the pharmacy as it has been before, stated she has tried to contact multiple times about this but hasn't gotten any response . [facility] pharmacy is still the [facility].  [id number] [place], [place] - [address] AT [facility] [address] [facility] [address] & [facility] [address] [place] [address] Phone: [phone] Fax: [phone]

## 2024-01-10 NOTE — Telephone Encounter (Signed)
 Called pt and LM informing this was sent on 6/12

## 2024-01-11 NOTE — Telephone Encounter (Signed)
 Copied from CRM [id number]. Topic: Clinical - Prescription Issue >> Jan 11, 2024  9:35 AM Amber [name] wrote: Reason for CRM: PT states the RX for the B12 is only one dose at a time in the script but she needs this done weekly so she needs 4 injections called in at a time so she can do these at home herself. 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  [id number] [place], [place] - [address] AT [facility] OF [facility] [address] & [facility] [address] [place] [address] Phone: [phone] Fax: [phone] Hours: Not open 24 hours   Please call patient back at [phone] when this is completed so she knows and will cancel appt Friday if this is done before the appt.  Thank you so much!

## 2024-01-11 NOTE — Telephone Encounter (Signed)
 LM for pt she was only supposed to do weekly injections for 4 weeks then monthly which is why it was only sent for a month at at time. If she would like for multiple doses to be sent that is fine, just let us  know but moving forward she only needs to do monthly b12 injections since she had her 4 weekly ones at the office.

## 2024-01-13 ENCOUNTER — Ambulatory Visit

## 2024-03-12 DIAGNOSIS — S62657A Nondisplaced fracture of medial phalanx of left little finger, initial encounter for closed fracture: Secondary | ICD-10-CM | POA: Diagnosis not present

## 2024-03-14 DIAGNOSIS — S62657A Nondisplaced fracture of medial phalanx of left little finger, initial encounter for closed fracture: Secondary | ICD-10-CM | POA: Diagnosis not present

## 2024-05-02 DIAGNOSIS — Z1231 Encounter for screening mammogram for malignant neoplasm of breast: Secondary | ICD-10-CM | POA: Diagnosis not present

## 2024-05-02 DIAGNOSIS — Z01419 Encounter for gynecological examination (general) (routine) without abnormal findings: Secondary | ICD-10-CM | POA: Diagnosis not present

## 2024-05-08 ENCOUNTER — Other Ambulatory Visit: Payer: Self-pay | Admitting: Obstetrics and Gynecology

## 2024-05-08 DIAGNOSIS — R928 Other abnormal and inconclusive findings on diagnostic imaging of breast: Secondary | ICD-10-CM

## 2024-05-18 ENCOUNTER — Ambulatory Visit
Admission: RE | Admit: 2024-05-18 | Discharge: 2024-05-18 | Disposition: A | Discharge status: Home / Self Care | Source: Ambulatory Visit | Attending: Obstetrics and Gynecology | Admitting: Obstetrics and Gynecology

## 2024-05-18 ENCOUNTER — Ambulatory Visit

## 2024-05-18 DIAGNOSIS — R928 Other abnormal and inconclusive findings on diagnostic imaging of breast: Secondary | ICD-10-CM | POA: Diagnosis not present

## 2024-06-05 DIAGNOSIS — F419 Anxiety disorder, unspecified: Secondary | ICD-10-CM | POA: Diagnosis not present

## 2024-06-27 ENCOUNTER — Other Ambulatory Visit: Payer: Self-pay | Admitting: Physician Assistant

## 2024-06-27 DIAGNOSIS — R079 Chest pain, unspecified: Secondary | ICD-10-CM

## 2024-06-27 DIAGNOSIS — R55 Syncope and collapse: Secondary | ICD-10-CM

## 2024-06-27 NOTE — Progress Notes (Signed)
 Pt had chest pain and pre syncopal event

## 2024-06-28 ENCOUNTER — Ambulatory Visit: Payer: Self-pay | Admitting: Physician Assistant

## 2024-06-28 ENCOUNTER — Ambulatory Visit

## 2024-06-28 ENCOUNTER — Ambulatory Visit: Attending: Physician Assistant | Admitting: Physician Assistant

## 2024-06-28 VITALS — BP 102/78 | HR 72 | Ht 66.0 in | Wt 132.6 lb

## 2024-06-28 DIAGNOSIS — R002 Palpitations: Secondary | ICD-10-CM

## 2024-06-28 DIAGNOSIS — R55 Syncope and collapse: Secondary | ICD-10-CM | POA: Diagnosis not present

## 2024-06-28 LAB — CBC
Hematocrit: 44.9 % (ref 34.0–46.6)
Hemoglobin: 14.7 g/dL (ref 11.1–15.9)
MCH: 32.7 pg (ref 26.6–33.0)
MCHC: 32.7 g/dL (ref 31.5–35.7)
MCV: 100 fL — ABNORMAL HIGH (ref 79–97)
Platelets: 244 x10E3/uL (ref 150–450)
RBC: 4.5 x10E6/uL (ref 3.77–5.28)
RDW: 11.8 % (ref 11.7–15.4)
WBC: 9.6 x10E3/uL (ref 3.4–10.8)

## 2024-06-28 LAB — COMPREHENSIVE METABOLIC PANEL WITH GFR
ALT: 19 IU/L (ref 0–32)
AST: 26 IU/L (ref 0–40)
Albumin: 4.7 g/dL (ref 3.9–4.9)
Alkaline Phosphatase: 71 IU/L (ref 41–116)
BUN/Creatinine Ratio: 8 — ABNORMAL LOW (ref 9–23)
BUN: 7 mg/dL (ref 6–24)
Bilirubin Total: 0.4 mg/dL (ref 0.0–1.2)
CO2: 26 mmol/L (ref 20–29)
Calcium: 9.9 mg/dL (ref 8.7–10.2)
Chloride: 97 mmol/L (ref 96–106)
Creatinine, Ser: 0.85 mg/dL (ref 0.57–1.00)
Globulin, Total: 2.5 g/dL (ref 1.5–4.5)
Glucose: 91 mg/dL (ref 70–99)
Potassium: 4.2 mmol/L (ref 3.5–5.2)
Sodium: 138 mmol/L (ref 134–144)
Total Protein: 7.2 g/dL (ref 6.0–8.5)
eGFR: 87 mL/min/1.73 (ref >59)

## 2024-06-28 LAB — PRO B NATRIURETIC PEPTIDE: NT-Pro BNP: 74 pg/mL (ref 0–130)

## 2024-06-28 LAB — TROPONIN T: Troponin T (Highly Sensitive): <6 ng/L (ref 0–14)

## 2024-06-28 LAB — TSH: TSH: 1.75 u[IU]/mL (ref 0.450–4.500)

## 2024-06-28 NOTE — Progress Notes (Signed)
 [facility]   MULTIDISCIPLINARY HEART VALVE CLINIC                                     Cardiology Office Note:    Date:  06/28/2024   ID:  Aleck LITTIE Aliment, DOB 1980-12-15, MRN [id number]  PCP:  Lendia Boby LITTIE, NP-C  [facility] Cardiologist:  Soyla DELENA Merck, MD  [facility] Structural heart: None [facility] Electrophysiologist:  None   Referring MD: Lendia Boby LITTIE, NP-C   Evaluation of pre syncope and palpitations   History of Present Illness:    Amber [name] is a 43 y.o. female with no significant past medical history who presents to clinic for evaluation of pre syncope and palpitations.  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 She has 4 children. Half brother recently had a cardiac arrest in the setting of MI and ICD placed. Father has history of afib. She takes no medications or OTC vitamins.    Yesterday at work she stood up and felt faint dizzy and shaky. Pre syncope but no syncope. + prodrome of diaphoresis and nausea. BG was normal. BP 190/90 and then 108/40. Orthostatics checked and negative. Put on tele at work and HRs ranging from 60-120 just laying there. She went home and felt poorly all day. Had some chest tightness associated with event. Labs ordered including BMET, TSH, CBC, HStrop, BNP were all normal.   Today she presents for follow up. No CP or SOB. No LE edema, orthopnea or PND. No dizziness or syncope. No blood in stool or urine. HR has felt more normal today. Has not been sleeping well.    Past Medical History:  Diagnosis Date   Abnormal Pap smear    H/O varicella    Vaginal Pap smear, abnormal      Current Medications: Active Medications[1]    ROS:   Please see the history of present illness.    All other systems reviewed and are negative.  EKGs   EKG Interpretation Date/Time:  Thursday June 28 2024  13:19:52 EST Ventricular Rate:  72 PR Interval:  154 QRS Duration:  76 QT Interval:  360 QTC Calculation: 394 R Axis:   18  Text Interpretation: Normal sinus rhythm Confirmed by Sebastian Collar [phone]) on 06/28/2024 2:27:18 PM   Risk Assessment/Calculations:            Physical Exam:    VS:  BP 102/78   Pulse 72   Ht 5' 6 (1.676 m)   Wt 132 lb 9.6 oz (60.1 kg)   BMI 21.40 kg/m     Wt Readings from Last 3 Encounters:  06/28/24 132 lb 9.6 oz (60.1 kg)  12/01/23 138 lb (62.6 kg)  12/30/22 141 lb (64 kg)     GEN: Well nourished, well developed in no acute distress NECK: No JVD CARDIAC: RRR, no murmurs, rubs, gallops RESPIRATORY:  Clear to auscultation without rales, wheezing or rhonchi  ABDOMEN: Soft, non-tender, non-distended EXTREMITIES:  No edema; No deformity.    ASSESSMENT:    1. Syncope and collapse   2. Palpitations     PLAN:    In order of problems listed above:  Pre syncope: -- I think this was vasovagal in the setting of increased stress with work, poor sleep and father's illness. --  Exam and labwork normal.  -- Will get 2D echo.   Palpitations: -- HRs elevated on home watch and on tele at work Warden/ranger). -- Plan Zio XT 3 days.   Medication Adjustments/Labs and Tests Ordered: Current medicines are reviewed at length with the patient today.  Concerns regarding medicines are outlined above.  Orders Placed This Encounter  Procedures   LONG TERM MONITOR (3-14 DAYS)   EKG 12-Lead   ECHOCARDIOGRAM COMPLETE   No orders of the defined types were placed in this encounter.   Patient Instructions  Medication Instructions:  Your physician recommends that you continue on your current medications as directed. Please refer to the Current Medication list given to you today.  *If you need a refill on your cardiac medications before your next appointment, please call your pharmacy*  Lab Work: None needed If you have labs (blood work) drawn today  and your tests are completely normal, you will receive your results only by: MyChart Message (if you have MyChart) OR A paper copy in the mail If you have any lab test that is abnormal or we need to change your treatment, we will call you to review the results.  Testing/Procedures: 07/02/24 Your physician has requested that you have an echocardiogram. Echocardiography is a painless test that uses sound waves to create images of your heart. It provides your doctor with information about the size and shape of your heart and how well your hearts chambers and valves are working. This procedure takes approximately one hour. There are no restrictions for this procedure. Please do NOT wear cologne, perfume, aftershave, or lotions (deodorant is allowed). Please arrive 15 minutes prior to your appointment time.  Please note: We ask at that you not bring children with you during ultrasound (echo/ vascular) testing. Due to room size and safety concerns, children are not allowed in the ultrasound rooms during exams. Our front office staff cannot provide observation of children in our lobby area while testing is being conducted. An adult accompanying a patient to their appointment will only be allowed in the ultrasound room at the discretion of the ultrasound technician under special circumstances. We apologize for any inconvenience.   Follow-Up: At [facility], you and your health needs are our priority.  As part of our continuing mission to provide you with exceptional heart care, our providers are all part of one team.  This team includes your primary Cardiologist (physician) and Advanced Practice Providers or APPs (Physician Assistants and Nurse Practitioners) who all work together to provide you with the care you need, when you need it.  Your next appointment:   As scheduled on 07/10/24  Provider:   Dr. Soyla Merck  We recommend signing up for the patient portal called MyChart.  Sign up  information is provided on this After Visit Summary.  MyChart is used to connect with patients for Virtual Visits (Telemedicine).  Patients are able to view lab/test results, encounter notes, upcoming appointments, etc.  Non-urgent messages can be sent to your provider as well.   To learn more about what you can do with MyChart, go to [email or web address].   Other Instructions Please see your Zio Patch instructions for further details on your heart monitor           Signed, Lamarr Hummer, PA-C  06/28/2024 4:42 PM    [facility]     [1]  Current Meds  Medication Sig   cyanocobalamin  (VITAMIN B12) 1000 MCG/ML injection Inject 1  mL (1,000 mcg total) into the muscle every 30 (thirty) days   Multiple Vitamin (MULTIVITAMIN ADULT PO) Multivitamin   SYRINGE-NEEDLE, DISP, 3 ML (BD SAFETYGLIDE SYRINGE/NEEDLE) 25G X 1 3 ML MISC Use for B12 injections. Inject 1 mL (1,000 mcg total) into the muscle every 30 (thirty) days

## 2024-06-28 NOTE — Patient Instructions (Signed)
 Medication Instructions:  Your physician recommends that you continue on your current medications as directed. Please refer to the Current Medication list given to you today.  *If you need a refill on your cardiac medications before your next appointment, please call your pharmacy*  Lab Work: None needed If you have labs (blood work) drawn today and your tests are completely normal, you will receive your results only by: MyChart Message (if you have MyChart) OR A paper copy in the mail If you have any lab test that is abnormal or we need to change your treatment, we will call you to review the results.  Testing/Procedures: 07/02/24 Your physician has requested that you have an echocardiogram. Echocardiography is a painless test that uses sound waves to create images of your heart. It provides your doctor with information about the size and shape of your heart and how well your hearts chambers and valves are working. This procedure takes approximately one hour. There are no restrictions for this procedure. Please do NOT wear cologne, perfume, aftershave, or lotions (deodorant is allowed). Please arrive 15 minutes prior to your appointment time.  Please note: We ask at that you not bring children with you during ultrasound (echo/ vascular) testing. Due to room size and safety concerns, children are not allowed in the ultrasound rooms during exams. Our front office staff cannot provide observation of children in our lobby area while testing is being conducted. An adult accompanying a patient to their appointment will only be allowed in the ultrasound room at the discretion of the ultrasound technician under special circumstances. We apologize for any inconvenience.   Follow-Up: At [facility], you and your health needs are our priority.  As part of our continuing mission to provide you with exceptional heart care, our providers are all part of one team.  This team includes your primary  Cardiologist (physician) and Advanced Practice Providers or APPs (Physician Assistants and Nurse Practitioners) who all work together to provide you with the care you need, when you need it.  Your next appointment:   As scheduled on 07/10/24  Provider:   Dr. Soyla Merck  We recommend signing up for the patient portal called MyChart.  Sign up information is provided on this After Visit Summary.  MyChart is used to connect with patients for Virtual Visits (Telemedicine).  Patients are able to view lab/test results, encounter notes, upcoming appointments, etc.  Non-urgent messages can be sent to your provider as well.   To learn more about what you can do with MyChart, go to [email or web address].   Other Instructions Please see your [employer] Patch instructions for further details on your heart monitor

## 2024-06-28 NOTE — Progress Notes (Unsigned)
 Applied a 3 day monitor to patient in the office  Loni to read

## 2024-07-02 ENCOUNTER — Ambulatory Visit (HOSPITAL_COMMUNITY): Admission: RE | Admit: 2024-07-02 | Discharge: 2024-07-02 | Attending: Cardiology

## 2024-07-02 DIAGNOSIS — R55 Syncope and collapse: Secondary | ICD-10-CM

## 2024-07-02 LAB — ECHOCARDIOGRAM COMPLETE
Area-P 1/2: 3.3 cm2
Calc EF: 56.7 %
S' Lateral: 2.9 cm
Single Plane A2C EF: 57.8 %
Single Plane A4C EF: 54.9 %

## 2024-07-03 ENCOUNTER — Ambulatory Visit: Payer: Self-pay | Admitting: Physician Assistant

## 2024-07-04 ENCOUNTER — Ambulatory Visit (HOSPITAL_COMMUNITY)

## 2024-07-10 ENCOUNTER — Ambulatory Visit: Admitting: Internal Medicine

## 2024-08-14 DIAGNOSIS — R55 Syncope and collapse: Secondary | ICD-10-CM

## 2024-08-14 DIAGNOSIS — R002 Palpitations: Secondary | ICD-10-CM
